# Patient Record
Sex: Female | Born: 1980 | Race: Black or African American | Hispanic: No | Marital: Single | Smoking: Former smoker
Health system: Southern US, Community
[De-identification: ages and names within clinical notes are randomized; demographics above are authoritative.]

## PROBLEM LIST (undated history)

## (undated) ENCOUNTER — Inpatient Hospital Stay (HOSPITAL_COMMUNITY): Payer: Self-pay

## (undated) DIAGNOSIS — R87619 Unspecified abnormal cytological findings in specimens from cervix uteri: Secondary | ICD-10-CM

## (undated) DIAGNOSIS — IMO0002 Reserved for concepts with insufficient information to code with codable children: Secondary | ICD-10-CM

## (undated) DIAGNOSIS — I10 Essential (primary) hypertension: Secondary | ICD-10-CM

## (undated) DIAGNOSIS — O139 Gestational [pregnancy-induced] hypertension without significant proteinuria, unspecified trimester: Secondary | ICD-10-CM

## (undated) DIAGNOSIS — D649 Anemia, unspecified: Secondary | ICD-10-CM

## (undated) HISTORY — DX: Essential (primary) hypertension: I10

## (undated) HISTORY — DX: Unspecified abnormal cytological findings in specimens from cervix uteri: R87.619

## (undated) HISTORY — DX: Reserved for concepts with insufficient information to code with codable children: IMO0002

---
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
## 2001-02-13 ENCOUNTER — Ambulatory Visit (HOSPITAL_COMMUNITY): Admission: RE | Admit: 2001-02-13 | Discharge: 2001-02-13 | Payer: Self-pay | Admitting: *Deleted

## 2001-04-27 ENCOUNTER — Inpatient Hospital Stay (HOSPITAL_COMMUNITY): Admission: AD | Admit: 2001-04-27 | Discharge: 2001-04-27 | Payer: Self-pay | Admitting: *Deleted

## 2001-04-29 ENCOUNTER — Encounter (INDEPENDENT_AMBULATORY_CARE_PROVIDER_SITE_OTHER): Payer: Self-pay

## 2001-04-29 ENCOUNTER — Inpatient Hospital Stay (HOSPITAL_COMMUNITY): Admission: AD | Admit: 2001-04-29 | Discharge: 2001-05-04 | Payer: Self-pay | Admitting: *Deleted

## 2001-04-29 ENCOUNTER — Encounter: Payer: Self-pay | Admitting: *Deleted

## 2001-05-07 ENCOUNTER — Inpatient Hospital Stay (HOSPITAL_COMMUNITY): Admission: AD | Admit: 2001-05-07 | Discharge: 2001-05-07 | Payer: Self-pay | Admitting: *Deleted

## 2002-05-03 ENCOUNTER — Emergency Department (HOSPITAL_COMMUNITY): Admission: EM | Admit: 2002-05-03 | Discharge: 2002-05-03 | Payer: Self-pay | Admitting: Emergency Medicine

## 2002-11-22 ENCOUNTER — Emergency Department (HOSPITAL_COMMUNITY): Admission: EM | Admit: 2002-11-22 | Discharge: 2002-11-22 | Payer: Self-pay | Admitting: Emergency Medicine

## 2005-03-01 ENCOUNTER — Inpatient Hospital Stay (HOSPITAL_COMMUNITY): Admission: AD | Admit: 2005-03-01 | Discharge: 2005-03-01 | Payer: Self-pay | Admitting: Family Medicine

## 2005-08-09 ENCOUNTER — Inpatient Hospital Stay (HOSPITAL_COMMUNITY): Admission: RE | Admit: 2005-08-09 | Discharge: 2005-08-12 | Payer: Self-pay | Admitting: Obstetrics

## 2005-12-10 ENCOUNTER — Emergency Department (HOSPITAL_COMMUNITY): Admission: EM | Admit: 2005-12-10 | Discharge: 2005-12-11 | Payer: Self-pay | Admitting: Emergency Medicine

## 2006-02-14 ENCOUNTER — Emergency Department (HOSPITAL_COMMUNITY): Admission: EM | Admit: 2006-02-14 | Discharge: 2006-02-14 | Payer: Self-pay | Admitting: Emergency Medicine

## 2006-05-02 ENCOUNTER — Encounter (INDEPENDENT_AMBULATORY_CARE_PROVIDER_SITE_OTHER): Payer: Self-pay | Admitting: *Deleted

## 2006-07-29 ENCOUNTER — Inpatient Hospital Stay (HOSPITAL_COMMUNITY): Admission: AD | Admit: 2006-07-29 | Discharge: 2006-07-29 | Payer: Self-pay | Admitting: Obstetrics & Gynecology

## 2006-07-29 ENCOUNTER — Ambulatory Visit: Payer: Self-pay | Admitting: *Deleted

## 2006-08-06 ENCOUNTER — Ambulatory Visit: Payer: Self-pay | Admitting: Obstetrics & Gynecology

## 2006-08-15 ENCOUNTER — Ambulatory Visit: Payer: Self-pay | Admitting: Obstetrics & Gynecology

## 2006-08-15 ENCOUNTER — Encounter: Payer: Self-pay | Admitting: Obstetrics & Gynecology

## 2006-08-15 ENCOUNTER — Inpatient Hospital Stay (HOSPITAL_COMMUNITY): Admission: RE | Admit: 2006-08-15 | Discharge: 2006-08-18 | Payer: Self-pay | Admitting: Obstetrics & Gynecology

## 2006-08-21 ENCOUNTER — Inpatient Hospital Stay (HOSPITAL_COMMUNITY): Admission: AD | Admit: 2006-08-21 | Discharge: 2006-08-21 | Payer: Self-pay | Admitting: Obstetrics and Gynecology

## 2006-08-21 ENCOUNTER — Ambulatory Visit: Payer: Self-pay | Admitting: *Deleted

## 2007-05-08 ENCOUNTER — Emergency Department (HOSPITAL_COMMUNITY): Admission: EM | Admit: 2007-05-08 | Discharge: 2007-05-09 | Payer: Self-pay | Admitting: Emergency Medicine

## 2007-08-22 ENCOUNTER — Emergency Department (HOSPITAL_COMMUNITY): Admission: EM | Admit: 2007-08-22 | Discharge: 2007-08-22 | Payer: Self-pay | Admitting: Emergency Medicine

## 2008-05-29 ENCOUNTER — Emergency Department (HOSPITAL_COMMUNITY): Admission: EM | Admit: 2008-05-29 | Discharge: 2008-05-30 | Payer: Self-pay | Admitting: Emergency Medicine

## 2009-01-04 ENCOUNTER — Emergency Department (HOSPITAL_COMMUNITY): Admission: EM | Admit: 2009-01-04 | Discharge: 2009-01-04 | Payer: Self-pay | Admitting: Family Medicine

## 2009-06-30 ENCOUNTER — Emergency Department (HOSPITAL_COMMUNITY): Admission: EM | Admit: 2009-06-30 | Discharge: 2009-06-30 | Payer: Self-pay | Admitting: Family Medicine

## 2009-08-29 ENCOUNTER — Emergency Department (HOSPITAL_COMMUNITY): Admission: EM | Admit: 2009-08-29 | Discharge: 2009-08-29 | Payer: Self-pay | Admitting: Family Medicine

## 2010-01-23 ENCOUNTER — Emergency Department (HOSPITAL_COMMUNITY): Admission: EM | Admit: 2010-01-23 | Discharge: 2010-01-23 | Payer: Self-pay | Admitting: Emergency Medicine

## 2010-06-14 LAB — CBC
MCV: 81.2 fL (ref 78.0–100.0)
Platelets: 176 10*3/uL (ref 150–400)
RBC: 4.98 MIL/uL (ref 3.87–5.11)
WBC: 7.7 10*3/uL (ref 4.0–10.5)

## 2010-06-14 LAB — URINALYSIS, ROUTINE W REFLEX MICROSCOPIC
Bilirubin Urine: NEGATIVE
Glucose, UA: NEGATIVE mg/dL
Hgb urine dipstick: NEGATIVE
Protein, ur: NEGATIVE mg/dL
Urobilinogen, UA: 1 mg/dL (ref 0.0–1.0)

## 2010-06-14 LAB — BASIC METABOLIC PANEL
BUN: 5 mg/dL — ABNORMAL LOW (ref 6–23)
Chloride: 107 mEq/L (ref 96–112)
Creatinine, Ser: 0.84 mg/dL (ref 0.4–1.2)
GFR calc Af Amer: >60 mL/min (ref >60)
GFR calc non Af Amer: >60 mL/min (ref >60)
Potassium: 3.6 mEq/L (ref 3.5–5.1)

## 2010-06-14 LAB — URINE MICROSCOPIC-ADD ON

## 2010-06-14 LAB — DIFFERENTIAL
Lymphocytes Relative: 13 % (ref 12–46)
Lymphs Abs: 1 10*3/uL (ref 0.7–4.0)
Monocytes Relative: 10 % (ref 3–12)
Neutro Abs: 5.8 10*3/uL (ref 1.7–7.7)
Neutrophils Relative %: 75 % (ref 43–77)

## 2010-06-14 LAB — POCT PREGNANCY, URINE: Preg Test, Ur: NEGATIVE

## 2010-06-17 ENCOUNTER — Emergency Department (HOSPITAL_COMMUNITY)
Admission: EM | Admit: 2010-06-17 | Discharge: 2010-06-17 | Disposition: A | Discharge status: Home / Self Care | Payer: Self-pay | Attending: Emergency Medicine | Admitting: Emergency Medicine

## 2010-07-17 NOTE — Discharge Summary (Signed)
NAME:  Jenny Mann, Jenny Mann           ACCOUNT NO.:  [id number]   MEDICAL RECORD NO.:  [id number]          PATIENT TYPE:  INP   LOCATION:  9109                          FACILITY:  [facility]   PHYSICIAN:  Sylvan Cheese, M.D.       DATE OF BIRTH:  December 14, 1980   DATE OF ADMISSION:  08/15/2006  DATE OF DISCHARGE:  08/18/2006                               DISCHARGE SUMMARY   DISCHARGE DIAGNOSES:  1. Delivery of viable female infant via a repeat low transverse cesarean      section.  2. Obesity.  3. Late prenatal care.   DISCHARGE MEDICATIONS:  1. Ibuprofen 600 mg one tablet every 6 hours as needed for pain.  2. Percocet 5/325 1-2 tablets every 4-6 hours as needed for pain.  3. Depo-Provera shot given prior to discharge.   PERTINENT LAB RESULTS:  Blood type O+, RPR nonreactive, rubella immune,  hepatitis B surface antigen negative, GBS positive, HIV nonreactive.   HOSPITAL COURSE:  This is a 30 year old gravida 3, para 3, 0-0-3 African  American female who was admitted for scheduled cesarean section with a  history of previous cesarean section x2.  She underwent a low transverse  cesarean section by Dr. Penne Lash and Dr. Wilburt Finlay without any  complication.  Please see operative note for full details of this  procedure.  This procedure resulted in the delivery of a viable female  infant with Apgars of 8 at one minute and 8 at 5 minutes.  He weighed 7  pounds 3 ounces and was transferred to the neonatal intensive care unit  for a pneumothorax.  The patient does desire circumcision for this  infant and this will be done when the baby is determined to be  stabilized by the NICU physicians.  The patient had a routine post  operative course.  She is currently bottle feeding and desires Depo-  Provera prior to discharge and then IUD insertion at her 6-week follow-  up visit.  Her staples will be removed on post operative day #5 by a  [facility] nurse in her home.  Jackline has been counseled extensively  regarding the risk of repeat pregnancy given that she had a uterine  window at the time of delivery.   FOLLOW-UP INSTRUCTIONS:  As mentioned above Sofi will have her staples  removed in 2 days by a [facility] nurse.  She was [facility] 6-week postpartum check and IUD insertion.           ______________________________  Sylvan Cheese, M.D.     MJ/MEDQ  D:  08/18/2006  T:  08/18/2006  Job:  [id number]

## 2010-07-17 NOTE — Op Note (Signed)
NAME:  Jenny Mann, Jenny Mann           ACCOUNT NO.:  [id number]   MEDICAL RECORD NO.:  [id number]          PATIENT TYPE:  INP   LOCATION:  9109                          FACILITY:  [facility]   PHYSICIAN:  Lesly Dukes, M.D. DATE OF BIRTH:  20-Nov-1980   DATE OF PROCEDURE:  08/15/2006  DATE OF DISCHARGE:                               OPERATIVE REPORT   PREOPERATIVE DIAGNOSES:  1. Intrauterine pregnancy at 18 weeks' gestation.  2. History of previous cesarean section x2.  3. Anemia.  4. Obesity.  5. Late prenatal care.   POSTOPERATIVE DIAGNOSES:  1. Intrauterine pregnancy at 29 weeks' gestation.  2. History of previous cesarean section x2.  3. Anemia.  4. Obesity.  5. Late prenatal care.   PROCEDURE:  Repeat low transverse cesarean section.   SURGEON:  Dr. Elsie Lincoln.   ASSISTANT:  Dr. Wilburt Finlay.   ANESTHESIA:  Spinal.   SPECIMEN:  Placenta sent to labor and delivery, and cord blood sent to  laboratory.   ESTIMATED BLOOD LOSS:  800.   COMPLICATIONS:  None.   FINDINGS:  Viable female infant with Apgars of 8 and 8, birth weight 7  pounds 3 ounces.   PROCEDURE:  The patient was taken to the operating room where her spinal  anesthesia was found to be adequate.  She was then prepped and draped in  a normal sterile fashion in the dorsal supine position with a leftward  tilt.  A Pfannenstiel skin incision was then made with scalpel and  carried through to the underlying layer of fascia.  The fascia was  incised in the midline and the incision extended laterally with the Mayo  scissors.  The superior aspect of the fascial incision was then grasped  with the Kocher clamps, elevated and underlying rectus muscles dissected  off bluntly.  Attention was then turned to the inferior aspect of the  incision which in a similar fashion was grasped, tented up with Kocher  clamps and the rectus muscles dissected off bluntly.  The rectus muscles  were then separated in the midline and the  peritoneum identified and  entered sharply with Metzenbaum scissors.  The peritoneal incision was  extended superiorly and inferiorly with good visualization of the  bladder.  The bladder blade was inserted.  There was noted to be a very  large 6 cm translucent window with the baby's head and the baby's hair  could be seen in the lower uterine segment.  The lower uterine segment  was incised in a transverse fashion with the scalpel.  This uterine  incision was then extended bluntly.  The bladder blade was removed and  the infant's head delivered atraumatically.  The nose and mouth were  suctioned and the cord clamped and cut.  The infant was handed off to  the waiting pediatricians.  Cord blood was obtained.  Placenta was then  removed spontaneously.  The uterus exteriorized and cleared of all clots  and debris.  The uterine incision was repaired with 0 Vicryl in a  running locked fashion.  Excellent hemostasis was noted.  The uterine  incision was inspected several  times with excellent hemostasis noted.  The muscles were inspected and the electrocautery used with good  hemostasis.  The gutters were cleared of all clots, and the fascia was  reapproximated with 0 Vicryl in a running fashion.  The skin was closed  with staples.  The patient tolerated the procedure well.  Sponge, lap  and needle counts were correct x2.  Two grams of Ancef was given prior  to procedure.  The patient was taken to the recovery room in stable  condition.     ______________________________  Paticia Stack, MD      Lesly Dukes, M.D.  Electronically Signed    LNJ/MEDQ  D:  08/15/2006  T:  08/15/2006  Job:  [id number]

## 2010-07-20 NOTE — Op Note (Signed)
[name], [name]               ACCOUNT NO.:  [id number]   MEDICAL RECORD NO.:  [id number]          PATIENT TYPE:  INP   LOCATION:  9122                          FACILITY:  [facility]   PHYSICIAN:  Kathreen Cosier, M.D.DATE OF BIRTH:  1980-09-20   DATE OF PROCEDURE:  08/09/2005  DATE OF DISCHARGE:                                 OPERATIVE REPORT   The patient is a 30 year old gravida 2, para 1-0-0-1, EDC 08/12/05, who had a  previous cesarean section and desired a repeat.   DISREGARD DICTATION.           ______________________________  Kathreen Cosier, M.D.     BAM/MEDQ  D:  08/09/2005  T:  08/10/2005  Job:  [id number]

## 2010-07-20 NOTE — Discharge Summary (Signed)
[facility]  Patient:    Jenny Mann, Jenny Mann Visit Number: [id number] MRN: [id number]          Service Type: MED Location: [facility] Attending Physician:  Michaelle Copas Dictated by:   Elinor Dodge, M.D. Admit Date:  05/07/2001 Discharge Date: 05/07/2001                             Discharge Summary  HOSPITAL COURSE:              The patient was admitted to [facility] on April 29, 2001 with preeclampsia, term pregnancy.  She underwent low transverse cesarean section by Dr. Gavin Potters for nonreassuring fetal heart pattern.  Postoperative course was normal.  DISPOSITION:                  She was given instructions as to follow up, when to return for staple removal. She was to call the clinic if she had any chills, fever, or redness of the abdominal incision.  She was to avoid heavy lifting.  She was started on Ortho Evra for contraception and continued on multivitamins.  She was discharged on Motrin and Percocet. Dictated by:   Elinor Dodge, M.D. Attending Physician:  Michaelle Copas DD:  06/08/01 TD:  06/09/01 Job: [id number]

## 2010-07-20 NOTE — Op Note (Signed)
Jenny Mann, Jenny Mann               ACCOUNT NO.:  [id number]   MEDICAL RECORD NO.:  [id number]          PATIENT TYPE:  INP   LOCATION:  9122                          [facility]ACILITY:  [facility]   PHYSICIAN:  Kathreen Cosier, M.D.DATE OF BIRTH:  09/07/1980   DATE OF PROCEDURE:  08/09/2005  DATE OF DISCHARGE:                                 OPERATIVE REPORT   PREOPERATIVE DIAGNOSIS:  Previous cesarean section at term, desires repeat.   POSTOPERATIVE DIAGNOSIS:   OPERATION PERFORMED:   SURGEON:  Kathreen Cosier, M.D.   ASSISTANT:  Tamela Oddi.   ANESTHESIA:  Spinal.   DESCRIPTION OF PROCEDURE:  Patient placed on the operating table in supine  position after the spinal administered.  Abdomen prepped.  Bladder emptied  with a Foley catheter.  A transverse suprapubic incision was made through  the old scar and carried down to the fascia.  The fascia cleaned and incised  the length of the incision.  The rectus muscles were retracted laterally,  peritoneum incised longitudinally.  Transverse incision made in the viscera  peritoneum above the bladder and the bladder mobilized inferiorly.  Transverse lower uterine incision made.  The fluid was clear.  The patient  delivered from the LOA position.  Viable female, Apgar 8 and 9, weighing 7  pounds, 14 ounces.  Team was in attendance.  Placenta was posterior and  removed manually.  Uterine cavity cleaned with dry laps.  Uterine incision  closed in one layer with continuous suture of #1 chromic.  Hemostasis  satisfactory.  Bladder flap reattached with 2-0 chromic.  Uterus well  contracted.  Tubes and ovaries normal.  Abdomen closed in layers.  Peritoneum continuous suture of 0 chromic.  Fascia continuous suture of 0  Dexon and the skin closed with subcuticular stitch of 4-0 Monocryl.  Blood  loss 500 mL.  The patient tolerated the procedure well, taken to recovery  room in good condition.           ______________________________  Kathreen Cosier, M.D.     BAM/MEDQ  D:  08/09/2005  T:  08/10/2005  Job:  [id number]

## 2010-07-20 NOTE — Op Note (Signed)
[facility]  Patient:    Jenny Mann, Jenny Mann Visit Number: [id number] MRN: [id number]          Service Type: OBS Location: 9400 [id number] Attending Physician:  Michaelle Copas Dictated by:   Conni Elliot, M.D. Proc. Date: 04/30/01 Admit Date:  04/29/2001                             Operative Report  PREOPERATIVE DIAGNOSES:       1. Pregnancy with late uterine decelerations.                               2. Significant decrease in short-term                                  variability.                               3. Preeclampsia.  POSTOPERATIVE DIAGNOSES:      1. Pregnancy with late uterine decelerations.                               2. Significant decrease in short-term                                  variability.                               3. Preeclampsia.  OPERATION:                    Low transverse cesarean section.  SURGEON:                      Conni Elliot, M.D.  ASSISTANT:                    ______, M.D.  ANESTHESIA:                   Continual epidural.  OPERATIVE FINDINGS:           A 7 lb 14 oz female with Apgars of 8 and 9. Cord pH 7.28.  The placenta was sent to pathology.  DESCRIPTION OF PROCEDURE:     The patient was brought to the operating room with oxygen.  The abdomen was prepped and draped in a sterile fashion.  A low transverse Pfannenstiel incision was made.  The incision was continued through the fascia.  The rectus muscles were separated in the midline.  The peritoneal cavity was entered.  A bladder flap and a lower transverse uterine incision was made.  The infants vertex was delivered.  The infant was DeLee suctioned prior to delivery of the chest.  The remainder of the body was delivered.  The cord was doubly clamped and cut.  The baby was handed to the neonatologist in attendance.  The placenta was delivered manually.  The uterus, bladder flap, anterior peritoneum, fascia and skin was  reapproximated.  Estimated blood loss was less than 800 cc. Dictated by:   Conni Elliot, M.D. Attending  Physician:  Michaelle Copas DD:  05/01/01 TD:  05/01/01 Job: [id number]

## 2010-07-20 NOTE — Discharge Summary (Signed)
Jenny Mann, Jenny Mann               ACCOUNT NO.:  [id number]   MEDICAL RECORD NO.:  [id number]          PATIENT TYPE:  INP   LOCATION:  9122                          FACILITY:  [facility]   PHYSICIAN:  Kathreen Cosier, M.D.DATE OF BIRTH:  01-17-1981   DATE OF ADMISSION:  08/09/2005  DATE OF DISCHARGE:  08/12/2005                                 DISCHARGE SUMMARY   The patient is a 31 year old gravida 2, para 1-0-0-1 who had a previous C-  section.  Her EDC was August 12, 2005, and she desired repeat C-section.  She  was now at term.  She had a repeat low transverse caesarean section and had  a female, Apgar 8 and 9, weighing 7 pounds 14 ounces, in the LOA position.  Post delivery, she had some vaginal bleeding, and a number of clots were  removed from the vagina.  She was given 1 dose of Methergine and Humibid.  On post-op day 1, her hemoglobin was 7.2, down from 10.  The patient was  asymptomatic.  Her platelets were 177 and then 123, white count 10.2.  PT/PTT was normal.  Sodium 136, potassium 3.7, chloride 106.  Urine was  negative.  She was discharged on the third postoperative day with a  hemoglobin of 7.2 on Tylox for pain and ferrous sulfate for anemia.   DISCHARGE DIAGNOSES:  1.  Status post elective repeat caesarean section at term.  2.  Postpartum hemorrhage.           ______________________________  Kathreen Cosier, M.D.     BAM/MEDQ  D:  09/04/2005  T:  09/04/2005  Job:  [id number]

## 2010-11-26 LAB — URINALYSIS, ROUTINE W REFLEX MICROSCOPIC
Glucose, UA: NEGATIVE
Hgb urine dipstick: NEGATIVE
Protein, ur: NEGATIVE
Specific Gravity, Urine: 1.015
pH: 5.5

## 2010-11-26 LAB — URINE MICROSCOPIC-ADD ON

## 2010-12-01 ENCOUNTER — Emergency Department (HOSPITAL_COMMUNITY): Payer: Self-pay

## 2010-12-01 ENCOUNTER — Emergency Department (HOSPITAL_COMMUNITY)
Admission: EM | Admit: 2010-12-01 | Discharge: 2010-12-01 | Disposition: A | Discharge status: Home / Self Care | Payer: Self-pay | Attending: Emergency Medicine | Admitting: Emergency Medicine

## 2010-12-01 DIAGNOSIS — Y92009 Unspecified place in unspecified non-institutional (private) residence as the place of occurrence of the external cause: Secondary | ICD-10-CM | POA: Insufficient documentation

## 2010-12-01 DIAGNOSIS — M21619 Bunion of unspecified foot: Secondary | ICD-10-CM | POA: Insufficient documentation

## 2010-12-01 DIAGNOSIS — S93409A Sprain of unspecified ligament of unspecified ankle, initial encounter: Secondary | ICD-10-CM | POA: Insufficient documentation

## 2010-12-01 DIAGNOSIS — S93609A Unspecified sprain of unspecified foot, initial encounter: Secondary | ICD-10-CM | POA: Insufficient documentation

## 2010-12-01 DIAGNOSIS — W010XXA Fall on same level from slipping, tripping and stumbling without subsequent striking against object, initial encounter: Secondary | ICD-10-CM | POA: Insufficient documentation

## 2010-12-19 LAB — CBC
HCT: 27.1 — ABNORMAL LOW
MCHC: 32.7
MCV: 65.9 — ABNORMAL LOW
Platelets: 260
WBC: 7.8

## 2010-12-19 LAB — URINALYSIS, ROUTINE W REFLEX MICROSCOPIC
Ketones, ur: NEGATIVE
Nitrite: NEGATIVE
Protein, ur: NEGATIVE
pH: 7.5

## 2010-12-19 LAB — COMPREHENSIVE METABOLIC PANEL
Albumin: 2.3 — ABNORMAL LOW
BUN: 6
Calcium: 8.4
Chloride: 106
Creatinine, Ser: 0.75
GFR calc non Af Amer: >60
Total Bilirubin: 0.4

## 2010-12-19 LAB — URIC ACID: Uric Acid, Serum: 5.8

## 2010-12-20 LAB — POCT URINALYSIS DIP (DEVICE)
Ketones, ur: NEGATIVE
Protein, ur: NEGATIVE
Specific Gravity, Urine: 1.01
pH: 7.5

## 2010-12-20 LAB — RPR: RPR Ser Ql: NONREACTIVE

## 2010-12-20 LAB — CBC
HCT: 25.1 — ABNORMAL LOW
MCHC: 32.6
Platelets: 136 — ABNORMAL LOW
RBC: 3.78 — ABNORMAL LOW
RBC: 4.82
RDW: 17 — ABNORMAL HIGH
WBC: 8.7

## 2011-05-08 ENCOUNTER — Emergency Department (HOSPITAL_COMMUNITY): Admission: EM | Admit: 2011-05-08 | Discharge: 2011-05-08 | Payer: Self-pay | Source: Home / Self Care

## 2011-11-25 DIAGNOSIS — R87613 High grade squamous intraepithelial lesion on cytologic smear of cervix (HGSIL): Secondary | ICD-10-CM | POA: Insufficient documentation

## 2012-03-10 ENCOUNTER — Ambulatory Visit (HOSPITAL_COMMUNITY): Payer: Self-pay

## 2012-03-13 ENCOUNTER — Ambulatory Visit (INDEPENDENT_AMBULATORY_CARE_PROVIDER_SITE_OTHER): Payer: Self-pay | Admitting: Obstetrics & Gynecology

## 2012-03-13 ENCOUNTER — Other Ambulatory Visit (HOSPITAL_COMMUNITY)
Admission: RE | Admit: 2012-03-13 | Discharge: 2012-03-13 | Disposition: A | Discharge status: Home / Self Care | Payer: Self-pay | Source: Ambulatory Visit | Attending: Obstetrics & Gynecology | Admitting: Obstetrics & Gynecology

## 2012-03-13 ENCOUNTER — Encounter: Payer: Self-pay | Admitting: Obstetrics & Gynecology

## 2012-03-13 VITALS — BP 120/73 | HR 84 | Temp 97.2°F | Ht 63.0 in | Wt 304.0 lb

## 2012-03-13 DIAGNOSIS — R87619 Unspecified abnormal cytological findings in specimens from cervix uteri: Secondary | ICD-10-CM | POA: Insufficient documentation

## 2012-03-13 DIAGNOSIS — Z3201 Encounter for pregnancy test, result positive: Secondary | ICD-10-CM

## 2012-03-13 DIAGNOSIS — R87613 High grade squamous intraepithelial lesion on cytologic smear of cervix (HGSIL): Secondary | ICD-10-CM

## 2012-03-13 LAB — POCT PREGNANCY, URINE: Preg Test, Ur: POSITIVE — AB

## 2012-03-13 NOTE — Patient Instructions (Signed)

## 2012-03-13 NOTE — Progress Notes (Signed)
32 y.o. [id number] here for colposcopy for high-grade squamous intraepithelial neoplasia  (HGSIL-encompassing moderate and severe dysplasia) pap smear on 11/25/11.  Patient recently found out she was pregnant, unsure LMP of 01/15/12, had two positive UPT at home. Discussed role for HPV in cervical dysplasia, need for surveillance.  Patient given informed consent, signed copy in the chart, time out was performed.  Placed in lithotomy position. Cervix viewed with speculum and colposcope after application of acetic acid.   Colposcopy adequate? Yes Areas of acetowhite change and mosaicism seen from 11 to 1 o'clock and around 9 o'clock; two biopsies obtained.  ECC specimen obtained. All specimens were labeled and sent to pathology.  Patient was given post procedure instructions.  Will follow up pathology and manage accordingly.  Routine preventative health maintenance measures emphasized.  Patient will decide about where to initiate prenatal care.

## 2012-03-17 ENCOUNTER — Encounter: Payer: Self-pay | Admitting: *Deleted

## 2012-03-17 ENCOUNTER — Encounter: Payer: Self-pay | Admitting: Obstetrics & Gynecology

## 2012-04-15 ENCOUNTER — Ambulatory Visit (INDEPENDENT_AMBULATORY_CARE_PROVIDER_SITE_OTHER): Payer: Self-pay | Admitting: Family Medicine

## 2012-04-15 ENCOUNTER — Encounter: Payer: Self-pay | Admitting: Family Medicine

## 2012-04-15 VITALS — BP 131/88 | Temp 97.1°F | Wt 307.8 lb

## 2012-04-15 DIAGNOSIS — Z3492 Encounter for supervision of normal pregnancy, unspecified, second trimester: Secondary | ICD-10-CM

## 2012-04-15 DIAGNOSIS — T63301A Toxic effect of unspecified spider venom, accidental (unintentional), initial encounter: Secondary | ICD-10-CM

## 2012-04-15 DIAGNOSIS — O34219 Maternal care for unspecified type scar from previous cesarean delivery: Secondary | ICD-10-CM

## 2012-04-15 DIAGNOSIS — T63391A Toxic effect of venom of other spider, accidental (unintentional), initial encounter: Secondary | ICD-10-CM

## 2012-04-15 DIAGNOSIS — O0990 Supervision of high risk pregnancy, unspecified, unspecified trimester: Secondary | ICD-10-CM | POA: Insufficient documentation

## 2012-04-15 DIAGNOSIS — T6391XA Toxic effect of contact with unspecified venomous animal, accidental (unintentional), initial encounter: Secondary | ICD-10-CM

## 2012-04-15 DIAGNOSIS — O09299 Supervision of pregnancy with other poor reproductive or obstetric history, unspecified trimester: Secondary | ICD-10-CM

## 2012-04-15 LAB — GLUCOSE TOLERANCE, 1 HOUR (50G) W/O FASTING: Glucose, 1 Hour GTT: 88 mg/dL (ref 70–140)

## 2012-04-15 MED ORDER — RANITIDINE HCL 150 MG PO TABS: 150.0000 mg | ORAL_TABLET | Freq: Two times a day (BID) | ORAL | Status: DC

## 2012-04-15 MED ORDER — ONDANSETRON HCL 4 MG PO TABS: 4.0000 mg | ORAL_TABLET | Freq: Three times a day (TID) | ORAL | Status: DC | PRN

## 2012-04-15 MED ORDER — PRENATAL VITAMINS 0.8 MG PO TABS: 1.0000 | ORAL_TABLET | Freq: Every day | ORAL | Status: DC

## 2012-04-15 MED ORDER — PROMETHAZINE HCL 25 MG PO TABS: 25.0000 mg | ORAL_TABLET | Freq: Four times a day (QID) | ORAL | Status: DC | PRN

## 2012-04-15 NOTE — Patient Instructions (Signed)
Pregnancy - Second Trimester The second trimester of pregnancy (3 to 6 months) is a period of rapid growth for you and your baby. At the end of the sixth month, your baby is about 9 inches long and weighs 1 1/2 pounds. You will begin to feel the baby move between 18 and 20 weeks of the pregnancy. This is called quickening. Weight gain is faster. A clear fluid (colostrum) may leak out of your breasts. You may feel small contractions of the womb (uterus). This is known as false labor or Braxton-Hicks contractions. This is like a practice for labor when the baby is ready to be born. Usually, the problems with morning sickness have usually passed by the end of your first trimester. Some women develop small dark blotches (called cholasma, mask of pregnancy) on their face that usually goes away after the baby is born. Exposure to the sun makes the blotches worse. Acne may also develop in some pregnant women and pregnant women who have acne, may find that it goes away. PRENATAL EXAMS  Blood work may continue to be done during prenatal exams. These tests are done to check on your health and the probable health of your baby. Blood work is used to follow your blood levels (hemoglobin). Anemia (low hemoglobin) is common during pregnancy. Iron and vitamins are given to help prevent this. You will also be checked for diabetes between 24 and 28 weeks of the pregnancy. Some of the previous blood tests may be repeated.  The size of the uterus is measured during each visit. This is to make sure that the baby is continuing to grow properly according to the dates of the pregnancy.  Your blood pressure is checked every prenatal visit. This is to make sure you are not getting toxemia.  Your urine is checked to make sure you do not have an infection, diabetes or protein in the urine.  Your weight is checked often to make sure gains are happening at the suggested rate. This is to ensure that both you and your baby are growing  normally.  Sometimes, an ultrasound is performed to confirm the proper growth and development of the baby. This is a test which bounces harmless sound waves off the baby so your caregiver can more accurately determine due dates. Sometimes, a specialized test is done on the amniotic fluid surrounding the baby. This test is called an amniocentesis. The amniotic fluid is obtained by sticking a needle into the belly (abdomen). This is done to check the chromosomes in instances where there is a concern about possible genetic problems with the baby. It is also sometimes done near the end of pregnancy if an early delivery is required. In this case, it is done to help make sure the baby's lungs are mature enough for the baby to live outside of the womb. CHANGES OCCURING IN THE SECOND TRIMESTER OF PREGNANCY Your body goes through many changes during pregnancy. They vary from person to person. Talk to your caregiver about changes you notice that you are concerned about.  During the second trimester, you will likely have an increase in your appetite. It is normal to have cravings for certain foods. This varies from person to person and pregnancy to pregnancy.  Your lower abdomen will begin to bulge.  You may have to urinate more often because the uterus and baby are pressing on your bladder. It is also common to get more bladder infections during pregnancy (pain with urination). You can help this by   drinking lots of fluids and emptying your bladder before and after intercourse.  You may begin to get stretch marks on your hips, abdomen, and breasts. These are normal changes in the body during pregnancy. There are no exercises or medications to take that prevent this change.  You may begin to develop swollen and bulging veins (varicose veins) in your legs. Wearing support hose, elevating your feet for 15 minutes, 3 to 4 times a day and limiting salt in your diet helps lessen the problem.  Heartburn may develop  as the uterus grows and pushes up against the stomach. Antacids recommended by your caregiver helps with this problem. Also, eating smaller meals 4 to 5 times a day helps.  Constipation can be treated with a stool softener or adding bulk to your diet. Drinking lots of fluids, vegetables, fruits, and whole grains are helpful.  Exercising is also helpful. If you have been very active up until your pregnancy, most of these activities can be continued during your pregnancy. If you have been less active, it is helpful to start an exercise program such as walking.  Hemorrhoids (varicose veins in the rectum) may develop at the end of the second trimester. Warm sitz baths and hemorrhoid cream recommended by your caregiver helps hemorrhoid problems.  Backaches may develop during this time of your pregnancy. Avoid heavy lifting, wear low heal shoes and practice good posture to help with backache problems.  Some pregnant women develop tingling and numbness of their hand and fingers because of swelling and tightening of ligaments in the wrist (carpel tunnel syndrome). This goes away after the baby is born.  As your breasts enlarge, you may have to get a bigger bra. Get a comfortable, cotton, support bra. Do not get a nursing bra until the last month of the pregnancy if you will be nursing the baby.  You may get a dark line from your belly button to the pubic area called the linea nigra.  You may develop rosy cheeks because of increase blood flow to the face.  You may develop spider looking lines of the face, neck, arms and chest. These go away after the baby is born. HOME CARE INSTRUCTIONS   It is extremely important to avoid all smoking, herbs, alcohol, and unprescribed drugs during your pregnancy. These chemicals affect the formation and growth of the baby. Avoid these chemicals throughout the pregnancy to ensure the delivery of a healthy infant.  Most of your home care instructions are the same as  suggested for the first trimester of your pregnancy. Keep your caregiver's appointments. Follow your caregiver's instructions regarding medication use, exercise and diet.  During pregnancy, you are providing food for you and your baby. Continue to eat regular, well-balanced meals. Choose foods such as meat, fish, milk and other low fat dairy products, vegetables, fruits, and whole-grain breads and cereals. Your caregiver will tell you of the ideal weight gain.  A physical sexual relationship may be continued up until near the end of pregnancy if there are no other problems. Problems could include early (premature) leaking of amniotic fluid from the membranes, vaginal bleeding, abdominal pain, or other medical or pregnancy problems.  Exercise regularly if there are no restrictions. Check with your caregiver if you are unsure of the safety of some of your exercises. The greatest weight gain will occur in the last 2 trimesters of pregnancy. Exercise will help you:  Control your weight.  Get you in shape for labor and delivery.  Lose weight   after you have the baby.  Wear a good support or jogging bra for breast tenderness during pregnancy. This may help if worn during sleep. Pads or tissues may be used in the bra if you are leaking colostrum.  Do not use hot tubs, steam rooms or saunas throughout the pregnancy.  Wear your seat belt at all times when driving. This protects you and your baby if you are in an accident.  Avoid raw meat, uncooked cheese, cat litter boxes and soil used by cats. These carry germs that can cause birth defects in the baby.  The second trimester is also a good time to visit your dentist for your dental health if this has not been done yet. Getting your teeth cleaned is OK. Use a soft toothbrush. Brush gently during pregnancy.  It is easier to loose urine during pregnancy. Tightening up and strengthening the pelvic muscles will help with this problem. Practice stopping your  urination while you are going to the bathroom. These are the same muscles you need to strengthen. It is also the muscles you would use as if you were trying to stop from passing gas. You can practice tightening these muscles up 10 times a set and repeating this about 3 times per day. Once you know what muscles to tighten up, do not perform these exercises during urination. It is more likely to contribute to an infection by backing up the urine.  Ask for help if you have financial, counseling or nutritional needs during pregnancy. Your caregiver will be able to offer counseling for these needs as well as refer you for other special needs.  Your skin may become oily. If so, wash your face with mild soap, use non-greasy moisturizer and oil or cream based makeup. MEDICATIONS AND DRUG USE IN PREGNANCY  Take prenatal vitamins as directed. The vitamin should contain 1 milligram of folic acid. Keep all vitamins out of reach of children. Only a couple vitamins or tablets containing iron may be fatal to a baby or young child when ingested.  Avoid use of all medications, including herbs, over-the-counter medications, not prescribed or suggested by your caregiver. Only take over-the-counter or prescription medicines for pain, discomfort, or fever as directed by your caregiver. Do not use aspirin.  Let your caregiver also know about herbs you may be using.  Alcohol is related to a number of birth defects. This includes fetal alcohol syndrome. All alcohol, in any form, should be avoided completely. Smoking will cause low birth rate and premature babies.  Street or illegal drugs are very harmful to the baby. They are absolutely forbidden. A baby born to an addicted mother will be addicted at birth. The baby will go through the same withdrawal an adult does. SEEK MEDICAL CARE IF:  You have any concerns or worries during your pregnancy. It is better to call with your questions if you feel they cannot wait, rather  than worry about them. SEEK IMMEDIATE MEDICAL CARE IF:   An unexplained oral temperature above 102 F (38.9 C) develops, or as your caregiver suggests.  You have leaking of fluid from the vagina (birth canal). If leaking membranes are suspected, take your temperature and tell your caregiver of this when you call.  There is vaginal spotting, bleeding, or passing clots. Tell your caregiver of the amount and how many pads are used. Light spotting in pregnancy is common, especially following intercourse.  You develop a bad smelling vaginal discharge with a change in the color from clear   to white.  You continue to feel sick to your stomach (nauseated) and have no relief from remedies suggested. You vomit blood or coffee ground-like materials.  You lose more than 2 pounds of weight or gain more than 2 pounds of weight over 1 week, or as suggested by your caregiver.  You notice swelling of your face, hands, feet, or legs.  You get exposed to [place] measles and have never had them.  You are exposed to fifth disease or chickenpox.  You develop belly (abdominal) pain. Round ligament discomfort is a common non-cancerous (benign) cause of abdominal pain in pregnancy. Your caregiver still must evaluate you.  You develop a bad headache that does not go away.  You develop fever, diarrhea, pain with urination, or shortness of breath.  You develop visual problems, blurry, or double vision.  You fall or are in a car accident or any kind of trauma.  There is mental or physical violence at home. Document Released: 02/12/2001 Document Revised: 05/13/2011 Document Reviewed: 08/17/2008 [facility] Patient Information 2013 [place], [place].  Hyperemesis Gravidarum Hyperemesis gravidarum is a severe form of nausea and vomiting that happens during pregnancy. Hyperemesis is worse than morning sickness. It may cause a woman to have nausea or vomiting all day for many days. It may keep a woman from eating and  drinking enough food and liquids. Hyperemesis usually occurs during the first half (the first 20 weeks) of pregnancy. It often goes away once a woman is in her second half of pregnancy. However, sometimes hyperemesis continues through an entire pregnancy.  CAUSES  The cause of this condition is not completely known but is thought to be due to changes in the body's hormones when pregnant. It could be the high level of the pregnancy hormone or an increase in estrogen in the body.  SYMPTOMS   Severe nausea and vomiting.  Nausea that does not go away.  Vomiting that does not allow you to keep any food down.  Weight loss and body fluid loss (dehydration).  Having no desire to eat or not liking food you have previously enjoyed. DIAGNOSIS  Your caregiver may ask you about your symptoms. Your caregiver may also order blood tests and urine tests to make sure something else is not causing the problem.  TREATMENT  You may only need medicine to control the problem. If medicines do not control the nausea and vomiting, you will be treated in the hospital to prevent dehydration, acidosis, weight loss, and changes in the electrolytes in your body that may harm the unborn baby (fetus). You may need intravenous (IV) fluids.  HOME CARE INSTRUCTIONS   Take all medicine as directed by your caregiver.  Try eating a couple of dry crackers or toast in the morning before getting out of bed.  Avoid foods and smells that upset your stomach.  Avoid fatty and spicy foods. Eat 5 to 6 small meals a day.  Do not drink when eating meals. Drink between meals.  For snacks, eat high protein foods, such as cheese. Eat or suck on things that have ginger in them. Ginger helps nausea.  Avoid food preparation. The smell of food can spoil your appetite.  Avoid iron pills and iron in your multivitamins until after 3 to 4 months of being pregnant. SEEK MEDICAL CARE IF:   Your abdominal pain increases since the last time  you saw your caregiver.  You have a severe headache.  You develop vision problems.  You feel you are losing weight.  SEEK IMMEDIATE MEDICAL CARE IF:   You are unable to keep fluids down.  You vomit blood.  You have constant nausea and vomiting.  You have a fever.  You have excessive weakness, dizziness, fainting, or extreme thirst. MAKE SURE YOU:   Understand these instructions.  Will watch your condition.  Will get help right away if you are not doing well or get worse. Document Released: 02/18/2005 Document Revised: 05/13/2011 Document Reviewed: 05/21/2010 [facility] Patient Information 2013 [place], [place].  Spider Bite Spider bites are not common. Most spider bites do not cause serious problems. The elderly, very young children, and people with certain existing medical conditions are more likely to experience significant symptoms. SYMPTOMS  Spider bites may not cause any pain at first. Within 1 or 2 days of the bite, there may be swelling, redness, and pain in the bite area. However, some spider bites can cause pain within the first hour. TREATMENT  Your caregiver may prescribe antibiotic medicine if a bacterial infection develops in the bite. However, not all spider bites require antibiotics or prescription medicines.  HOME CARE INSTRUCTIONS  Do not scratch the bite area.  Keep the bite area clean and dry. Wash the area with soap and water as directed.  Put ice or cool compresses on the bite area.  Put ice in a plastic bag.  Place a towel between your skin and the bag.  Leave the ice on for 20 minutes, 4 times a day for the first 2 to 3 days, or as directed.  Keep the bite area elevated above the level of your heart. This helps reduce redness and swelling.  Only take over-the-counter or prescription medicines as directed by your caregiver.  If you are given antibiotics, take them as directed. Finish them even if you start to feel better. You may need a tetanus  shot if:  You cannot remember when you had your last tetanus shot.  You have never had a tetanus shot.  The injury broke your skin. If you get a tetanus shot, your arm may swell, get red, and feel warm to the touch. This is common and not a problem. If you need a tetanus shot and you choose not to have one, there is a rare chance of getting tetanus. Sickness from tetanus can be serious. SEEK MEDICAL CARE IF: Your bite is not better after 3 days of treatment. SEEK IMMEDIATE MEDICAL CARE IF:  Your bite turns purple or develops increased swelling, pain, or redness.  You develop shortness of breath or chest pain.  You have muscle cramps or painful muscle spasms.  You develop abdominal pain, nausea, or vomiting.  You feel unusually tired or sleepy. MAKE SURE YOU:  Understand these instructions.  Will watch your condition.  Will get help right away if you are not doing well or get worse. Document Released: 03/28/2004 Document Revised: 05/13/2011 Document Reviewed: 09/19/2010 [facility] Patient Information 2013 [place], [place].

## 2012-04-15 NOTE — Progress Notes (Signed)
Patient c/o insect bites on the left hand and on her lower left back.  It is swollen, painful, red, itching and warm to the touch.  Pt is unsure what bit her as it happened during sleep.  She has not taken anything for it.  She put rubbing alcohol on the bites soon after they happened.

## 2012-04-15 NOTE — Progress Notes (Signed)
Subjective:    Jenny Mann is being seen today for her first obstetrical visit.  This is not a planned pregnancy. She is at [redacted]w[redacted]d gestation by approximate LMP, no ultrasounds yet. Her obstetrical history is significant for obesity, history of preeclampsia and GHTN, 3 prior c-sections. Relationship with FOB: significant other, living together. Patient does intend to breast feed. Pregnancy history fully reviewed.  Today she complains of nausea, excessive mouth watering/saliva, occasional sharp lower quadrant pains. Does have severe heartburn on occasion. No diarrhea, constipation, dysuria, vaginal discharge, bleeding, cramping. She does not feel baby move yet, has felt flutter once. She denies headache or vision changes today.  She also c/o bites on her left forearm and back. Felt sting last night, this morning hand is swollen and red, itchy. Back is also itchy. Did not see any spiders or insects.  Three full-term deliveries. First pregnancy with preeclampsia, induced but emergent c-section for fetal distress. Second and third scheduled repeat c/s at 39 weeks.    3rd child died at 9 months of age with SIDS.  Both other children live with her. Father of this baby is new partner.  HTN:  Preelampsia G1, pt thinks she had HTN 2nd and 3rd pregnancy but was not on medications and not sure about preeclampsia. She did have high BP after last delivery and was placed on HCTZ which she took for about a year. But she has not needed any BP meds over the last few years. Medical record reviewed, pt seen 6 days post-op after 3rd baby with incidentally elevated BP and started on HCTZ. After reviewing all other documents (mostly ED visits) since 2009, all recorded BP are normal without medication. Pt states she has seen doctor at [facility] last fall and states no HTN.  Patient had recent pap with HSIL and colpo with ECC concerning for high grade lesion (03/13/12).   Past Medical History  Diagnosis Date  .  Abnormal Pap smear   . Hypertension    Past Surgical History  Procedure Laterality Date  . Cesarean section x 3     Fam hx:  Diabetes: MGM, PGM. HTN:  Mom, MGM, MGF.   Soc Hx:  Stopped smoking > 1 year ago. Alc occasionally before found out pregnant. No drugs. 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 Not currently working.  Menstrual History: OB History   Grav Para Term Preterm Abortions TAB SAB Ect Mult Living   4 3 2  0 0 0 0 0 0 2      Patient's last menstrual period was at end of October, not certain of date. Also had 3 days bleeding in November.    The following portions of the patient's history were reviewed and updated as appropriate: allergies, current medications, past family history, past medical history, past social history, past surgical history and problem list.  Review of Systems Constitutional: negative for chills, fevers and sweats Eyes: negative for visual disturbance Respiratory: negative for asthma, cough and wheezing Cardiovascular: negative for chest pain, chest pressure/discomfort, dyspnea, irregular heart beat and palpitations Gastrointestinal: negative for abdominal pain, change in bowel habits and positive for nausea, heartburn Genitourinary:negative for genital lesions, vaginal discharge and dysuria Neurological: negative for dizziness and headaches    Objective:    BP 131/88  Temp(Src) 97.1 F (36.2 C)  Wt 307 lb 12.8 oz (139.617 kg)  BMI 54.54 kg/m2  LMP 01/02/2012 General appearance: alert, cooperative, no distress and morbidly obese Head: Normocephalic, without obvious abnormality, atraumatic Eyes:  conjunctivae/corneas clear. PERRL, EOM's intact. Fundi benign. Neck: supple, symmetrical, trachea midline and thyroid not enlarged, symmetric, no tenderness/mass/nodules Back: 4-5 cm area of erythema on left lower back with central papule Lungs: clear to auscultation bilaterally Heart: regular rate and rhythm, S1, S2 normal, no  murmur, click, rub or gallop Abdomen: Obese, non-tender, gravid, difficult to appreciate fundal height due to body habitus Pelvic: cervix normal in appearance, external genitalia normal, no cervical motion tenderness, vagina normal without discharge and exam limited by body habitus Extremities: no edema. Left hand with edema and erythema extending across dorsal side and up forearm. Small papule at wrist. Neurologic: Grossly normal    Assessment/Plan:    1.  Pregnancy at 14 and 6/7 weeks   -  Initial labs drawn including 1 hour GTT.  -  Ultrasound to confirm dating asap  - Prenatal vitamins  - Desires genetic testing, will get quad screen at 16-18 weeks  - Desires ultrasound for fetal anatomy survey - will order at 18-20 weeks  2.  History preeclampsia/PIH. BP normal today and no proteinuria. Possible chronic HTN but all BPs in medical record are normal since 2009 and pt has not been on medications. 3.  History previous c-section x 3 with very thin lower uterine segment last pregnancy. Pt desires TOLAC but discussed risks and need for c-section esp given op report from last c-section.  4.  Morbid obesity. Early 1 hour GTT, will try to get in to nutritionist 5.  Abnormal colposcopy.  Per Dr. Macon Large, offer LEEP after delivery 6.  Spider bites - ice, tylenol, benadryl as needed. Pt cautioned to return if lesions worsen or pain shoots up arm/back or fever/chills, nausea/vomiting 7.  F/U 4 weeks Likely will need HRC 8.  Problem list reviewed and updated. 9.  50% of 45 min visit spent on counseling and coordination of care.

## 2012-04-17 ENCOUNTER — Encounter: Payer: Self-pay | Admitting: Family Medicine

## 2012-04-17 ENCOUNTER — Ambulatory Visit (HOSPITAL_COMMUNITY): Payer: Self-pay

## 2012-04-17 LAB — OBSTETRIC PANEL
HCT: 38.3 % (ref 36.0–46.0)
Hemoglobin: 13.1 g/dL (ref 12.0–15.0)
Hepatitis B Surface Ag: NEGATIVE
Lymphs Abs: 1.2 10*3/uL (ref 0.7–4.0)
Monocytes Relative: 10 % (ref 3–12)
Neutro Abs: 4.9 10*3/uL (ref 1.7–7.7)
Neutrophils Relative %: 72 % (ref 43–77)
RBC: 4.71 MIL/uL (ref 3.87–5.11)
Rh Type: POSITIVE
Rubella: 4.04 Index — ABNORMAL HIGH (ref <0.90)

## 2012-04-20 ENCOUNTER — Ambulatory Visit (HOSPITAL_COMMUNITY)
Admission: RE | Admit: 2012-04-20 | Discharge: 2012-04-20 | Disposition: A | Discharge status: Home / Self Care | Payer: Self-pay | Source: Ambulatory Visit | Attending: Family Medicine | Admitting: Family Medicine

## 2012-04-20 ENCOUNTER — Other Ambulatory Visit: Payer: Self-pay | Admitting: Family Medicine

## 2012-04-20 DIAGNOSIS — Z3492 Encounter for supervision of normal pregnancy, unspecified, second trimester: Secondary | ICD-10-CM

## 2012-04-20 DIAGNOSIS — O30109 Triplet pregnancy, unspecified number of placenta and unspecified number of amniotic sacs, unspecified trimester: Secondary | ICD-10-CM | POA: Insufficient documentation

## 2012-04-20 DIAGNOSIS — E669 Obesity, unspecified: Secondary | ICD-10-CM | POA: Insufficient documentation

## 2012-04-20 DIAGNOSIS — O09299 Supervision of pregnancy with other poor reproductive or obstetric history, unspecified trimester: Secondary | ICD-10-CM | POA: Insufficient documentation

## 2012-04-20 DIAGNOSIS — O34219 Maternal care for unspecified type scar from previous cesarean delivery: Secondary | ICD-10-CM

## 2012-04-20 LAB — HEMOGLOBINOPATHY EVALUATION
Hemoglobin Other: 0 %
Hgb A2 Quant: 2.6 % (ref 2.2–3.2)

## 2012-04-21 ENCOUNTER — Ambulatory Visit (HOSPITAL_COMMUNITY): Payer: Self-pay

## 2012-04-22 ENCOUNTER — Encounter: Payer: Self-pay | Admitting: Family Medicine

## 2012-04-22 DIAGNOSIS — D573 Sickle-cell trait: Secondary | ICD-10-CM | POA: Insufficient documentation

## 2012-05-06 ENCOUNTER — Inpatient Hospital Stay (HOSPITAL_COMMUNITY): Payer: Medicaid Other

## 2012-05-06 ENCOUNTER — Inpatient Hospital Stay (HOSPITAL_COMMUNITY)
Admission: AD | Admit: 2012-05-06 | Discharge: 2012-05-06 | Disposition: A | Discharge status: Home / Self Care | Payer: Medicaid Other | Source: Ambulatory Visit | Attending: Obstetrics and Gynecology | Admitting: Obstetrics and Gynecology

## 2012-05-06 ENCOUNTER — Encounter (HOSPITAL_COMMUNITY): Payer: Self-pay | Admitting: *Deleted

## 2012-05-06 DIAGNOSIS — R51 Headache: Secondary | ICD-10-CM | POA: Insufficient documentation

## 2012-05-06 DIAGNOSIS — O30109 Triplet pregnancy, unspecified number of placenta and unspecified number of amniotic sacs, unspecified trimester: Secondary | ICD-10-CM | POA: Insufficient documentation

## 2012-05-06 DIAGNOSIS — R109 Unspecified abdominal pain: Secondary | ICD-10-CM | POA: Insufficient documentation

## 2012-05-06 DIAGNOSIS — O99891 Other specified diseases and conditions complicating pregnancy: Secondary | ICD-10-CM | POA: Insufficient documentation

## 2012-05-06 DIAGNOSIS — O9989 Other specified diseases and conditions complicating pregnancy, childbirth and the puerperium: Secondary | ICD-10-CM

## 2012-05-06 LAB — URINALYSIS, ROUTINE W REFLEX MICROSCOPIC
Bilirubin Urine: NEGATIVE
Leukocytes, UA: NEGATIVE
Nitrite: NEGATIVE
Specific Gravity, Urine: 1.025 (ref 1.005–1.030)
Urobilinogen, UA: 0.2 mg/dL (ref 0.0–1.0)

## 2012-05-06 LAB — WET PREP, GENITAL: Trich, Wet Prep: NONE SEEN

## 2012-05-06 MED ORDER — ACETAMINOPHEN 500 MG PO TABS
1000.0000 mg | ORAL_TABLET | Freq: Four times a day (QID) | ORAL | Status: DC | PRN
  Administered 2012-05-06: 1000 mg via ORAL
  Filled 2012-05-06: qty 2

## 2012-05-06 NOTE — MAU Provider Note (Signed)
History     CSN: [id number]  Arrival date and time: 05/06/12 2104   None     Chief Complaint  Patient presents with  . Headache  . Dysmenorrhea   HPI 32 y.o. [id number] with triplet pregnancy at 13w 4d by today's ultrasound here with headache for 3 days. Took Excedrin first day and it helped some but afraid to take every day. Pain is left side of head behind eye. No nausea/vomiting. No vision changes. Also having lower abdominal cramping last night and some today. More right-sided than left. No bleeding or loss of fluid. States feeling babies move well.  No dysuria but pressure with urination. No vaginal discharge.  Hx preeclampsia first pregnancy and HTN other two pregnancies and between pregnancies. Not on BP medications at this time.    OB History   Grav Para Term Preterm Abortions TAB SAB Ect Mult Living   4 3 3  0 0 0 0 0 0 2      Past Medical History  Diagnosis Date  . Abnormal Pap smear   . Hypertension    Patient Active Problem List  Diagnosis  . HGSIL on Pap smear of cervix  . Encounter for supervision of normal pregnancy in second trimester  . Previous cesarean delivery affecting pregnancy, antepartum  . Hx of preeclampsia, prior pregnancy, currently pregnant  . Sickle cell trait     Past Surgical History  Procedure Laterality Date  . Cesarean section      Family History  Problem Relation Age of Onset  . Diabetes Maternal Grandmother   . Diabetes Paternal Grandmother   . Kidney disease Paternal Grandmother     History  Substance Use Topics  . Smoking status: Former Games developer  . Smokeless tobacco: Never Used  . Alcohol Use: No    Allergies:  Allergies  Allergen Reactions  . Shellfish Allergy     Swelling and Hives    Prescriptions prior to admission  Medication Sig Dispense Refill  . Aspirin-Acetaminophen-Caffeine (EXCEDRIN PO) Take by mouth. migrain      . ondansetron (ZOFRAN) 4 MG tablet Take 1 tablet (4 mg total) by mouth every 8 (eight) hours  as needed for nausea.  30 tablet  2  . Prenatal Multivit-Min-Fe-FA (PRENATAL VITAMINS) 0.8 MG tablet Take 1 tablet by mouth daily.  30 tablet  12  . [DISCONTINUED] promethazine (PHENERGAN) 25 MG tablet Take 1 tablet (25 mg total) by mouth every 6 (six) hours as needed for nausea.  30 tablet  2  . [DISCONTINUED] ranitidine (ZANTAC) 150 MG tablet Take 1 tablet (150 mg total) by mouth 2 (two) times daily.  60 tablet  3    Review of Systems  Constitutional: Negative for fever and chills.  Eyes: Negative for blurred vision and double vision.  Respiratory: Negative for shortness of breath.   Cardiovascular: Negative for chest pain.  Gastrointestinal: Positive for abdominal pain. Negative for nausea, vomiting, diarrhea and constipation.  Genitourinary: Positive for dysuria. Negative for urgency.  Musculoskeletal: Negative for back pain.  Neurological: Positive for headaches. Negative for dizziness.   Physical Exam   Blood pressure 133/67, pulse 86, temperature 98.4 F (36.9 C), temperature source Oral, resp. rate 20, height 5\' 3"  (1.6 m), weight 137.44 kg (303 lb), last menstrual period 01/02/2012, SpO2 100.00%.  Physical Exam  Constitutional: She is oriented to person, place, and time. She appears well-developed and well-nourished. She appears distressed (mild distress due to headache).  HENT:  Head: Normocephalic and atraumatic.  Eyes: Conjunctivae  and EOM are normal.  Neck: Normal range of motion. Neck supple.  Cardiovascular: Normal rate, regular rhythm and normal heart sounds.   Respiratory: Effort normal and breath sounds normal. No respiratory distress.  GI: Soft. Bowel sounds are normal. There is no tenderness. There is no rebound and no guarding.  Genitourinary:  Normal external genitalia. Normal vagina and cervix. No CMT. Moderate white vag discharge. Feels pressure with palpation of R adnexa but no mass.   Musculoskeletal: Normal range of motion. She exhibits no edema and no  tenderness.  Neurological: She is alert and oriented to person, place, and time.  Skin: Skin is warm and dry.  Psychiatric: She has a normal mood and affect.    *RADIOLOGY REPORT*  Clinical Data: Assess viability of triplets.  LIMITED OBSTETRIC ULTRASOUND  Comparison: Ultrasound of pregnancy performed 04/20/2012  Number of Fetuses: 3  Fetus #1  Heart Rate: 155 bpm  Movement: Yes  Presentation: Transverse, head maternal right  Placental Location: Anterior  Previa: No  Amniotic Fluid (Subjective): Normal  Vertical Pocket 4.3 cm  CRL: 7.4 cm 13 w 4 d EDC: 11/07/2012  Fetus #2  Heart Rate: 161 bpm  Movement: Yes  Presentation: Transverse, head maternal left  Placental Location: Posterior  Previa: No  Amniotic Fluid (Subjective): Normal  Vertical Pocket 2.9 cm  CRL: 7.3 cm 13 w 4 d EDC: 11/07/2012  Fetus #3  Heart Rate: 163 bpm  Movement: Yes  Presentation: Cephalic  Placental Location: Anterior  Previa: No  Amniotic Fluid (Subjective): Normal  Vertical Pocket 2.4 cm  CRL: 6.9 cm 13 w 1 d EDC: 11/10/2012  MATERNAL FINDINGS:  Cervix: Closed; measures 3.9 cm in length.  Uterus/Adnexae: The uterus is otherwise unremarkable in  appearance. The ovaries are not assessed on this study, as they  were visualized on the recent prior ultrasound.  IMPRESSION:  Triplet gestation again noted, with intervening membranes as  previously seen. All three fetuses are currently viable; normal  amount of amniotic fluid seen. Crown-rump lengths match the  gestational age of [redacted] weeks 3 days by the prior ultrasound,  reflecting an estimated date of delivery of November 08, 2012.  Recommend followup with non-emergent complete OB 14+ wk US  examination for fetal biometric evaluation and anatomic survey if  not already performed.   Results for orders placed during the hospital encounter of 05/06/12 (from the past 24 hour(s))  URINALYSIS, ROUTINE W REFLEX MICROSCOPIC     Status: None    Collection Time    05/06/12  9:17 PM      Result Value Range   Color, Urine YELLOW  YELLOW   APPearance CLEAR  CLEAR   Specific Gravity, Urine 1.025  1.005 - 1.030   pH 6.0  5.0 - 8.0   Glucose, UA NEGATIVE  NEGATIVE mg/dL   Hgb urine dipstick NEGATIVE  NEGATIVE   Bilirubin Urine NEGATIVE  NEGATIVE   Ketones, ur NEGATIVE  NEGATIVE mg/dL   Protein, ur NEGATIVE  NEGATIVE mg/dL   Urobilinogen, UA 0.2  0.0 - 1.0 mg/dL   Nitrite NEGATIVE  NEGATIVE   Leukocytes, UA NEGATIVE  NEGATIVE  WET PREP, GENITAL     Status: Abnormal   Collection Time    05/06/12 10:55 PM      Result Value Range   Yeast Wet Prep HPF POC NONE SEEN  NONE SEEN   Trich, Wet Prep NONE SEEN  NONE SEEN   Clue Cells Wet Prep HPF POC FEW (*) NONE SEEN  WBC, Wet Prep HPF POC NONE SEEN  NONE SEEN     MAU Course  Procedures   Assessment and Plan  31 y.o. [id number] at [redacted]w[redacted]d by today's ultrasound with triplet pregnancy, viable.  - Headache - resolved with tylenol in MAU.  BP okay today. - Cramping - likely round ligament. Does have mild BV but not c/o discharge. Will wait to treat if symptomatic later in pregnancy. - Discharge home; f/u as scheduled in [facility]. Napoleon Form, MD  Napoleon Form 05/06/2012, 9:57 PM

## 2012-05-06 NOTE — MAU Note (Signed)
Pt states for the last 3 days she has had "thiese little cramps" in her stomach when she is in bed and tries to turn over. Also reports headaches off/on for 3 days. States she took over the counter med and it helped but she didn't want to keep taking meds because of the babies (pt is pregnant with triplets)

## 2012-05-07 LAB — GC/CHLAMYDIA PROBE AMP: GC Probe RNA: NEGATIVE

## 2012-05-10 NOTE — MAU Provider Note (Signed)
Attestation of Attending Supervision of Advanced Practitioner: Evaluation and management procedures were performed by the PA/NP/CNM/OB Fellow under my supervision/collaboration. Chart reviewed and agree with management and plan.  Tilda Burrow 05/10/2012 8:33 PM

## 2012-05-13 ENCOUNTER — Ambulatory Visit (INDEPENDENT_AMBULATORY_CARE_PROVIDER_SITE_OTHER): Payer: Medicaid Other | Admitting: Advanced Practice Midwife

## 2012-05-13 ENCOUNTER — Other Ambulatory Visit: Payer: Self-pay | Admitting: Advanced Practice Midwife

## 2012-05-13 ENCOUNTER — Encounter: Payer: Self-pay | Admitting: Advanced Practice Midwife

## 2012-05-13 VITALS — BP 144/85 | Temp 98.3°F | Wt 317.0 lb

## 2012-05-13 DIAGNOSIS — O0990 Supervision of high risk pregnancy, unspecified, unspecified trimester: Secondary | ICD-10-CM

## 2012-05-13 DIAGNOSIS — O30102 Triplet pregnancy, unspecified number of placenta and unspecified number of amniotic sacs, second trimester: Secondary | ICD-10-CM | POA: Insufficient documentation

## 2012-05-13 DIAGNOSIS — O30109 Triplet pregnancy, unspecified number of placenta and unspecified number of amniotic sacs, unspecified trimester: Secondary | ICD-10-CM

## 2012-05-13 LAB — POCT URINALYSIS DIP (DEVICE)
Bilirubin Urine: NEGATIVE
Glucose, UA: NEGATIVE mg/dL
Ketones, ur: NEGATIVE mg/dL
Leukocytes, UA: NEGATIVE
Protein, ur: NEGATIVE mg/dL

## 2012-05-13 NOTE — Progress Notes (Signed)
Transfer to [facility]. Bring baseline 24 hour urine to lab visit day of [place]. Change ED to 11/08/12 by 11.1 week [place].

## 2012-05-13 NOTE — Progress Notes (Signed)
Pulse- 88  Edema-feet  Pain/pressure- ligament

## 2012-05-13 NOTE — Patient Instructions (Signed)
24-Hour Urine Collection HOME CARE  When you get up in the morning on the day you do this test, pee (urinate) in the toilet and flush. Make a note of the time. This will be your start time on the day of collection and the end time on the next morning.  From then on, save all your pee (urine) in the plastic jug that was given to you.  You should stop collecting your pee 24 hours after you started.  If the plastic jug that is given to you already has liquid in it, that is okay. Do not throw out the liquid or rinse out the jug. Some tests need the liquid to be added to your pee.  Keep your plastic jug cool (in an ice chest or the refrigerator) during the test.  When the 24 hours is over, bring your plastic jug to the clinic lab. Keep the jug cool (in an ice chest) while you are bringing it to the lab. Document Released: 05/17/2008 Document Revised: 05/13/2011 Document Reviewed: 05/17/2008 [facility] Patient Information 2013 [place], [place].  Pregnancy - Second Trimester The second trimester of pregnancy (3 to 6 months) is a period of rapid growth for you and your baby. At the end of the sixth month, your baby is about 9 inches long and weighs 1 1/2 pounds. You will begin to feel the baby move between 18 and 20 weeks of the pregnancy. This is called quickening. Weight gain is faster. A clear fluid (colostrum) may leak out of your breasts. You may feel small contractions of the womb (uterus). This is known as false labor or Braxton-Hicks contractions. This is like a practice for labor when the baby is ready to be born. Usually, the problems with morning sickness have usually passed by the end of your first trimester. Some women develop small dark blotches (called cholasma, mask of pregnancy) on their face that usually goes away after the baby is born. Exposure to the sun makes the blotches worse. Acne may also develop in some pregnant women and pregnant women who have acne, may find that it goes  away. PRENATAL EXAMS  Blood work may continue to be done during prenatal exams. These tests are done to check on your health and the probable health of your baby. Blood work is used to follow your blood levels (hemoglobin). Anemia (low hemoglobin) is common during pregnancy. Iron and vitamins are given to help prevent this. You will also be checked for diabetes between 24 and 28 weeks of the pregnancy. Some of the previous blood tests may be repeated.  The size of the uterus is measured during each visit. This is to make sure that the baby is continuing to grow properly according to the dates of the pregnancy.  Your blood pressure is checked every prenatal visit. This is to make sure you are not getting toxemia.  Your urine is checked to make sure you do not have an infection, diabetes or protein in the urine.  Your weight is checked often to make sure gains are happening at the suggested rate. This is to ensure that both you and your baby are growing normally.  Sometimes, an ultrasound is performed to confirm the proper growth and development of the baby. This is a test which bounces harmless sound waves off the baby so your caregiver can more accurately determine due dates. Sometimes, a specialized test is done on the amniotic fluid surrounding the baby. This test is called an amniocentesis. The amniotic fluid  is obtained by sticking a needle into the belly (abdomen). This is done to check the chromosomes in instances where there is a concern about possible genetic problems with the baby. It is also sometimes done near the end of pregnancy if an early delivery is required. In this case, it is done to help make sure the baby's lungs are mature enough for the baby to live outside of the womb. CHANGES OCCURING IN THE SECOND TRIMESTER OF PREGNANCY Your body goes through many changes during pregnancy. They vary from person to person. Talk to your caregiver about changes you notice that you are concerned  about.  During the second trimester, you will likely have an increase in your appetite. It is normal to have cravings for certain foods. This varies from person to person and pregnancy to pregnancy.  Your lower abdomen will begin to bulge.  You may have to urinate more often because the uterus and baby are pressing on your bladder. It is also common to get more bladder infections during pregnancy (pain with urination). You can help this by drinking lots of fluids and emptying your bladder before and after intercourse.  You may begin to get stretch marks on your hips, abdomen, and breasts. These are normal changes in the body during pregnancy. There are no exercises or medications to take that prevent this change.  You may begin to develop swollen and bulging veins (varicose veins) in your legs. Wearing support hose, elevating your feet for 15 minutes, 3 to 4 times a day and limiting salt in your diet helps lessen the problem.  Heartburn may develop as the uterus grows and pushes up against the stomach. Antacids recommended by your caregiver helps with this problem. Also, eating smaller meals 4 to 5 times a day helps.  Constipation can be treated with a stool softener or adding bulk to your diet. Drinking lots of fluids, vegetables, fruits, and whole grains are helpful.  Exercising is also helpful. If you have been very active up until your pregnancy, most of these activities can be continued during your pregnancy. If you have been less active, it is helpful to start an exercise program such as walking.  Hemorrhoids (varicose veins in the rectum) may develop at the end of the second trimester. Warm sitz baths and hemorrhoid cream recommended by your caregiver helps hemorrhoid problems.  Backaches may develop during this time of your pregnancy. Avoid heavy lifting, wear low heal shoes and practice good posture to help with backache problems.  Some pregnant women develop tingling and numbness of  their hand and fingers because of swelling and tightening of ligaments in the wrist (carpel tunnel syndrome). This goes away after the baby is born.  As your breasts enlarge, you may have to get a bigger bra. Get a comfortable, cotton, support bra. Do not get a nursing bra until the last month of the pregnancy if you will be nursing the baby.  You may get a dark line from your belly button to the pubic area called the linea nigra.  You may develop rosy cheeks because of increase blood flow to the face.  You may develop spider looking lines of the face, neck, arms and chest. These go away after the baby is born. HOME CARE INSTRUCTIONS   It is extremely important to avoid all smoking, herbs, alcohol, and unprescribed drugs during your pregnancy. These chemicals affect the formation and growth of the baby. Avoid these chemicals throughout the pregnancy to ensure the delivery  of a healthy infant.  Most of your home care instructions are the same as suggested for the first trimester of your pregnancy. Keep your caregiver's appointments. Follow your caregiver's instructions regarding medication use, exercise and diet.  During pregnancy, you are providing food for you and your baby. Continue to eat regular, well-balanced meals. Choose foods such as meat, fish, milk and other low fat dairy products, vegetables, fruits, and whole-grain breads and cereals. Your caregiver will tell you of the ideal weight gain.  A physical sexual relationship may be continued up until near the end of pregnancy if there are no other problems. Problems could include early (premature) leaking of amniotic fluid from the membranes, vaginal bleeding, abdominal pain, or other medical or pregnancy problems.  Exercise regularly if there are no restrictions. Check with your caregiver if you are unsure of the safety of some of your exercises. The greatest weight gain will occur in the last 2 trimesters of pregnancy. Exercise will help  you:  Control your weight.  Get you in shape for labor and delivery.  Lose weight after you have the baby.  Wear a good support or jogging bra for breast tenderness during pregnancy. This may help if worn during sleep. Pads or tissues may be used in the bra if you are leaking colostrum.  Do not use hot tubs, steam rooms or saunas throughout the pregnancy.  Wear your seat belt at all times when driving. This protects you and your baby if you are in an accident.  Avoid raw meat, uncooked cheese, cat litter boxes and soil used by cats. These carry germs that can cause birth defects in the baby.  The second trimester is also a good time to visit your dentist for your dental health if this has not been done yet. Getting your teeth cleaned is OK. Use a soft toothbrush. Brush gently during pregnancy.  It is easier to loose urine during pregnancy. Tightening up and strengthening the pelvic muscles will help with this problem. Practice stopping your urination while you are going to the bathroom. These are the same muscles you need to strengthen. It is also the muscles you would use as if you were trying to stop from passing gas. You can practice tightening these muscles up 10 times a set and repeating this about 3 times per day. Once you know what muscles to tighten up, do not perform these exercises during urination. It is more likely to contribute to an infection by backing up the urine.  Ask for help if you have financial, counseling or nutritional needs during pregnancy. Your caregiver will be able to offer counseling for these needs as well as refer you for other special needs.  Your skin may become oily. If so, wash your face with mild soap, use non-greasy moisturizer and oil or cream based makeup. MEDICATIONS AND DRUG USE IN PREGNANCY  Take prenatal vitamins as directed. The vitamin should contain 1 milligram of folic acid. Keep all vitamins out of reach of children. Only a couple vitamins or  tablets containing iron may be fatal to a baby or young child when ingested.  Avoid use of all medications, including herbs, over-the-counter medications, not prescribed or suggested by your caregiver. Only take over-the-counter or prescription medicines for pain, discomfort, or fever as directed by your caregiver. Do not use aspirin.  Let your caregiver also know about herbs you may be using.  Alcohol is related to a number of birth defects. This includes fetal alcohol syndrome.  All alcohol, in any form, should be avoided completely. Smoking will cause low birth rate and premature babies.  Street or illegal drugs are very harmful to the baby. They are absolutely forbidden. A baby born to an addicted mother will be addicted at birth. The baby will go through the same withdrawal an adult does. SEEK MEDICAL CARE IF:  You have any concerns or worries during your pregnancy. It is better to call with your questions if you feel they cannot wait, rather than worry about them. SEEK IMMEDIATE MEDICAL CARE IF:   An unexplained oral temperature above 102 F (38.9 C) develops, or as your caregiver suggests.  You have leaking of fluid from the vagina (birth canal). If leaking membranes are suspected, take your temperature and tell your caregiver of this when you call.  There is vaginal spotting, bleeding, or passing clots. Tell your caregiver of the amount and how many pads are used. Light spotting in pregnancy is common, especially following intercourse.  You develop a bad smelling vaginal discharge with a change in the color from clear to white.  You continue to feel sick to your stomach (nauseated) and have no relief from remedies suggested. You vomit blood or coffee ground-like materials.  You lose more than 2 pounds of weight or gain more than 2 pounds of weight over 1 week, or as suggested by your caregiver.  You notice swelling of your face, hands, feet, or legs.  You get exposed to [place]  measles and have never had them.  You are exposed to fifth disease or chickenpox.  You develop belly (abdominal) pain. Round ligament discomfort is a common non-cancerous (benign) cause of abdominal pain in pregnancy. Your caregiver still must evaluate you.  You develop a bad headache that does not go away.  You develop fever, diarrhea, pain with urination, or shortness of breath.  You develop visual problems, blurry, or double vision.  You fall or are in a car accident or any kind of trauma.  There is mental or physical violence at home. Document Released: 02/12/2001 Document Revised: 05/13/2011 Document Reviewed: 08/17/2008 [facility] Patient Information 2013 [place], [place].

## 2012-05-28 ENCOUNTER — Ambulatory Visit (INDEPENDENT_AMBULATORY_CARE_PROVIDER_SITE_OTHER): Payer: Medicaid Other | Admitting: Obstetrics & Gynecology

## 2012-05-28 ENCOUNTER — Encounter: Payer: Self-pay | Admitting: Obstetrics & Gynecology

## 2012-05-28 VITALS — BP 155/98 | Temp 98.1°F | Wt 321.3 lb

## 2012-05-28 DIAGNOSIS — O30102 Triplet pregnancy, unspecified number of placenta and unspecified number of amniotic sacs, second trimester: Secondary | ICD-10-CM

## 2012-05-28 DIAGNOSIS — O10919 Unspecified pre-existing hypertension complicating pregnancy, unspecified trimester: Secondary | ICD-10-CM | POA: Insufficient documentation

## 2012-05-28 DIAGNOSIS — O30109 Triplet pregnancy, unspecified number of placenta and unspecified number of amniotic sacs, unspecified trimester: Secondary | ICD-10-CM

## 2012-05-28 DIAGNOSIS — O0990 Supervision of high risk pregnancy, unspecified, unspecified trimester: Secondary | ICD-10-CM

## 2012-05-28 DIAGNOSIS — O10912 Unspecified pre-existing hypertension complicating pregnancy, second trimester: Secondary | ICD-10-CM

## 2012-05-28 DIAGNOSIS — O10019 Pre-existing essential hypertension complicating pregnancy, unspecified trimester: Secondary | ICD-10-CM

## 2012-05-28 LAB — COMPREHENSIVE METABOLIC PANEL
ALT: 31 U/L (ref 0–35)
Albumin: 3.1 g/dL — ABNORMAL LOW (ref 3.5–5.2)
CO2: 24 mEq/L (ref 19–32)
Potassium: 3.7 mEq/L (ref 3.5–5.3)
Sodium: 134 mEq/L — ABNORMAL LOW (ref 135–145)
Total Bilirubin: 0.4 mg/dL (ref 0.3–1.2)
Total Protein: 5.9 g/dL — ABNORMAL LOW (ref 6.0–8.3)

## 2012-05-28 MED ORDER — METOPROLOL TARTRATE 25 MG PO TABS: 25.0000 mg | ORAL_TABLET | Freq: Two times a day (BID) | ORAL | Status: DC

## 2012-05-28 NOTE — Progress Notes (Signed)
Some movement. Hypertension noted. Start Metoprolol 25 mg bid. RTC 2 weeks F/U after [place]. CMP today

## 2012-05-28 NOTE — Addendum Note (Signed)
Addended by: Franchot Mimes on: 05/28/2012 11:40 AM   Modules accepted: Orders

## 2012-05-28 NOTE — Patient Instructions (Signed)

## 2012-05-28 NOTE — Progress Notes (Signed)
Patient c/o pain in her naval area.  Reports having a headache and has a BP = 155/98.  Has hx of preeclampsia.

## 2012-05-29 LAB — CREATININE CLEARANCE, URINE, 24 HOUR: Creatinine: 0.65 mg/dL (ref 0.50–1.10)

## 2012-05-29 LAB — PROTEIN, URINE, 24 HOUR
Protein, 24H Urine: 61 mg/d (ref 50–100)
Protein, Urine: 4 mg/dL

## 2012-06-04 ENCOUNTER — Ambulatory Visit (INDEPENDENT_AMBULATORY_CARE_PROVIDER_SITE_OTHER): Payer: Self-pay | Admitting: Advanced Practice Midwife

## 2012-06-04 ENCOUNTER — Other Ambulatory Visit: Payer: Self-pay | Admitting: Obstetrics & Gynecology

## 2012-06-04 VITALS — BP 115/68 | Wt 321.6 lb

## 2012-06-04 DIAGNOSIS — O09299 Supervision of pregnancy with other poor reproductive or obstetric history, unspecified trimester: Secondary | ICD-10-CM

## 2012-06-04 LAB — POCT URINALYSIS DIP (DEVICE)
Bilirubin Urine: NEGATIVE
Ketones, ur: NEGATIVE mg/dL
pH: 6.5 (ref 5.0–8.0)

## 2012-06-04 MED ORDER — DOCUSATE SODIUM 100 MG PO CAPS: 100.0000 mg | ORAL_CAPSULE | Freq: Two times a day (BID) | ORAL | Status: DC | PRN

## 2012-06-04 NOTE — Progress Notes (Signed)
Pulse: 86 Has not started metoprolol. Complains of constipation.

## 2012-06-04 NOTE — Progress Notes (Signed)
Doing well.  Good fetal movement, denies vaginal bleeding, LOF, regular contractions.  Reports problems with constipation.  Discussed high fiber foods, colace.  Pt not taking metoprolol yet, normal BP today, recheck BP in 1 week.  Prenatal visit in 2 weeks, following U/S.

## 2012-06-10 ENCOUNTER — Encounter (HOSPITAL_COMMUNITY): Payer: Self-pay | Admitting: Advanced Practice Midwife

## 2012-06-11 ENCOUNTER — Other Ambulatory Visit: Payer: Self-pay | Admitting: Obstetrics & Gynecology

## 2012-06-11 ENCOUNTER — Encounter: Payer: Self-pay | Admitting: Obstetrics and Gynecology

## 2012-06-11 LAB — POCT URINALYSIS DIP (DEVICE)
Glucose, UA: NEGATIVE mg/dL
Ketones, ur: NEGATIVE mg/dL
Specific Gravity, Urine: 1.015 (ref 1.005–1.030)

## 2012-06-17 ENCOUNTER — Encounter (HOSPITAL_COMMUNITY): Payer: Self-pay

## 2012-06-17 ENCOUNTER — Ambulatory Visit (HOSPITAL_COMMUNITY)
Admission: RE | Admit: 2012-06-17 | Discharge: 2012-06-17 | Disposition: A | Discharge status: Home / Self Care | Payer: Medicaid Other | Source: Ambulatory Visit | Attending: Advanced Practice Midwife | Admitting: Advanced Practice Midwife

## 2012-06-17 DIAGNOSIS — O358XX0 Maternal care for other (suspected) fetal abnormality and damage, not applicable or unspecified: Secondary | ICD-10-CM | POA: Insufficient documentation

## 2012-06-17 DIAGNOSIS — O99210 Obesity complicating pregnancy, unspecified trimester: Secondary | ICD-10-CM | POA: Insufficient documentation

## 2012-06-17 DIAGNOSIS — Z363 Encounter for antenatal screening for malformations: Secondary | ICD-10-CM | POA: Insufficient documentation

## 2012-06-17 DIAGNOSIS — O09299 Supervision of pregnancy with other poor reproductive or obstetric history, unspecified trimester: Secondary | ICD-10-CM | POA: Insufficient documentation

## 2012-06-17 DIAGNOSIS — O10019 Pre-existing essential hypertension complicating pregnancy, unspecified trimester: Secondary | ICD-10-CM | POA: Insufficient documentation

## 2012-06-17 DIAGNOSIS — Z1389 Encounter for screening for other disorder: Secondary | ICD-10-CM | POA: Insufficient documentation

## 2012-06-17 DIAGNOSIS — O30102 Triplet pregnancy, unspecified number of placenta and unspecified number of amniotic sacs, second trimester: Secondary | ICD-10-CM

## 2012-06-17 DIAGNOSIS — O34219 Maternal care for unspecified type scar from previous cesarean delivery: Secondary | ICD-10-CM | POA: Insufficient documentation

## 2012-06-17 DIAGNOSIS — O30109 Triplet pregnancy, unspecified number of placenta and unspecified number of amniotic sacs, unspecified trimester: Secondary | ICD-10-CM | POA: Insufficient documentation

## 2012-06-17 DIAGNOSIS — E669 Obesity, unspecified: Secondary | ICD-10-CM | POA: Insufficient documentation

## 2012-06-17 NOTE — ED Notes (Signed)
Pt reports she is still not taking her BP meds.  Her MD is aware.

## 2012-06-18 ENCOUNTER — Encounter: Payer: Self-pay | Admitting: Obstetrics and Gynecology

## 2012-06-18 ENCOUNTER — Encounter: Payer: Self-pay | Admitting: Advanced Practice Midwife

## 2012-06-19 ENCOUNTER — Inpatient Hospital Stay (HOSPITAL_COMMUNITY)
Admission: AD | Admit: 2012-06-19 | Discharge: 2012-06-19 | Disposition: A | Discharge status: Home / Self Care | Payer: Medicaid Other | Source: Ambulatory Visit | Attending: Family Medicine | Admitting: Family Medicine

## 2012-06-19 ENCOUNTER — Encounter (HOSPITAL_COMMUNITY): Payer: Self-pay

## 2012-06-19 DIAGNOSIS — R51 Headache: Secondary | ICD-10-CM | POA: Insufficient documentation

## 2012-06-19 DIAGNOSIS — O2390 Unspecified genitourinary tract infection in pregnancy, unspecified trimester: Secondary | ICD-10-CM | POA: Insufficient documentation

## 2012-06-19 DIAGNOSIS — O2342 Unspecified infection of urinary tract in pregnancy, second trimester: Secondary | ICD-10-CM

## 2012-06-19 DIAGNOSIS — N390 Urinary tract infection, site not specified: Secondary | ICD-10-CM | POA: Insufficient documentation

## 2012-06-19 DIAGNOSIS — O30109 Triplet pregnancy, unspecified number of placenta and unspecified number of amniotic sacs, unspecified trimester: Secondary | ICD-10-CM | POA: Insufficient documentation

## 2012-06-19 DIAGNOSIS — R109 Unspecified abdominal pain: Secondary | ICD-10-CM | POA: Insufficient documentation

## 2012-06-19 LAB — URINE MICROSCOPIC-ADD ON

## 2012-06-19 LAB — URINALYSIS, ROUTINE W REFLEX MICROSCOPIC
Glucose, UA: NEGATIVE mg/dL
pH: 6 (ref 5.0–8.0)

## 2012-06-19 MED ORDER — CEPHALEXIN 500 MG PO CAPS: 500.0000 mg | ORAL_CAPSULE | Freq: Four times a day (QID) | ORAL | Status: DC

## 2012-06-19 MED ORDER — CEPHALEXIN 500 MG PO CAPS
500.0000 mg | ORAL_CAPSULE | Freq: Once | ORAL | Status: AC
  Administered 2012-06-19: 500 mg via ORAL
  Filled 2012-06-19: qty 1

## 2012-06-19 MED ORDER — FAMOTIDINE 20 MG PO TABS
20.0000 mg | ORAL_TABLET | Freq: Once | ORAL | Status: AC
  Administered 2012-06-19: 20 mg via ORAL
  Filled 2012-06-19: qty 1

## 2012-06-19 MED ORDER — ACETAMINOPHEN 500 MG PO TABS
1000.0000 mg | ORAL_TABLET | Freq: Once | ORAL | Status: AC
  Administered 2012-06-19: 1000 mg via ORAL
  Filled 2012-06-19: qty 2

## 2012-06-19 MED ORDER — FAMOTIDINE 10 MG PO CHEW: 20.0000 mg | CHEWABLE_TABLET | Freq: Two times a day (BID) | ORAL | Status: DC

## 2012-06-19 NOTE — MAU Note (Signed)
Patient presents with headache from a bad tooth getting worse, cramping in lower abdomen just started this afternoon.

## 2012-06-19 NOTE — MAU Provider Note (Signed)
History     CSN: [id number]  Arrival date and time: 06/19/12 2026   None     Chief Complaint  Patient presents with  . Headache  . Abdominal Pain   HPI  Jenny Mann is a 32 y.o. [id number] at [redacted]w[redacted]d with a triplet pregnancy. She states that she has been having lower abdominal cramping for a "few hours". She denies any LOF or VB or vaginal discharge. She has been feeling the babies move. She has not taken anything for the pain. She is also having headache from a bad tooth. She would like a note so that she can go to the dentist to have the tooth pulled.   Past Medical History  Diagnosis Date  . Abnormal Pap smear   . Hypertension     Past Surgical History  Procedure Laterality Date  . Cesarean section      Family History  Problem Relation Age of Onset  . Diabetes Maternal Grandmother   . Diabetes Paternal Grandmother   . Kidney disease Paternal Grandmother     History  Substance Use Topics  . Smoking status: Former Games developer  . Smokeless tobacco: Never Used  . Alcohol Use: No    Allergies:  Allergies  Allergen Reactions  . Shellfish Allergy Anaphylaxis    Prescriptions prior to admission  Medication Sig Dispense Refill  . Prenatal Multivit-Min-Fe-FA (PRENATAL VITAMINS) 0.8 MG tablet Take 1 tablet by mouth daily.  30 tablet  12  . docusate sodium (COLACE) 100 MG capsule Take 1 capsule (100 mg total) by mouth 2 (two) times daily as needed for constipation.  30 capsule  2  . metoprolol tartrate (LOPRESSOR) 25 MG tablet Take 1 tablet (25 mg total) by mouth 2 (two) times daily.  60 tablet  2    Review of Systems  Constitutional: Negative for fever and chills.  Eyes: Negative for blurred vision.  Respiratory: Negative for shortness of breath.   Cardiovascular: Negative for chest pain.  Gastrointestinal: Positive for abdominal pain. Negative for nausea, vomiting, diarrhea and constipation.  Genitourinary: Positive for frequency. Negative for dysuria and urgency.   Musculoskeletal: Negative for myalgias.  Neurological: Positive for headaches. Negative for dizziness.   Physical Exam   Blood pressure 124/50, pulse 85, temperature 98.5 F (36.9 C), temperature source Oral, resp. rate 18, last menstrual period 01/02/2012.  Physical Exam  Nursing note and vitals reviewed. Constitutional: She is oriented to person, place, and time. She appears well-developed. No distress.  Cardiovascular: Normal rate.   Respiratory: Effort normal.  GI: Soft. She exhibits no distension. There is no tenderness.  Neurological: She is alert and oriented to person, place, and time.  Skin: Skin is warm and dry.  Psychiatric: She has a normal mood and affect.    MAU Course  Procedures  Results for orders placed during the hospital encounter of 06/19/12 (from the past 24 hour(s))  URINALYSIS, ROUTINE W REFLEX MICROSCOPIC     Status: Abnormal   Collection Time    06/19/12  8:35 PM      Result Value Range   Color, Urine YELLOW  YELLOW   APPearance CLEAR  CLEAR   Specific Gravity, Urine 1.025  1.005 - 1.030   pH 6.0  5.0 - 8.0   Glucose, UA NEGATIVE  NEGATIVE mg/dL   Hgb urine dipstick NEGATIVE  NEGATIVE   Bilirubin Urine NEGATIVE  NEGATIVE   Ketones, ur NEGATIVE  NEGATIVE mg/dL   Protein, ur NEGATIVE  NEGATIVE mg/dL   Urobilinogen,  UA 0.2  0.0 - 1.0 mg/dL   Nitrite POSITIVE (*) NEGATIVE   Leukocytes, UA MODERATE (*) NEGATIVE  URINE MICROSCOPIC-ADD ON     Status: Abnormal   Collection Time    06/19/12  8:35 PM      Result Value Range   Squamous Epithelial / LPF RARE  RARE   WBC, UA 7-10  <3 WBC/hpf   RBC / HPF 3-6  <3 RBC/hpf   Bacteria, UA MANY (*) RARE     Assessment and Plan   1. UTI in pregnancy, antepartum, second trimester    Kelfex 500mg  QID #27 First dose given here in MAU Note given for the dentist FU as scheduled in the clinic.   Tawnya Crook 06/19/2012, 9:22 PM

## 2012-06-20 NOTE — MAU Provider Note (Signed)
Chart reviewed and agree with management and plan.  

## 2012-06-21 LAB — URINE CULTURE: Colony Count: 50000

## 2012-07-02 ENCOUNTER — Ambulatory Visit (INDEPENDENT_AMBULATORY_CARE_PROVIDER_SITE_OTHER): Payer: Medicaid Other | Admitting: Family Medicine

## 2012-07-02 ENCOUNTER — Encounter: Payer: Self-pay | Admitting: Family Medicine

## 2012-07-02 VITALS — BP 130/83 | Temp 97.5°F | Wt 328.6 lb

## 2012-07-02 DIAGNOSIS — O10019 Pre-existing essential hypertension complicating pregnancy, unspecified trimester: Secondary | ICD-10-CM

## 2012-07-02 DIAGNOSIS — O10912 Unspecified pre-existing hypertension complicating pregnancy, second trimester: Secondary | ICD-10-CM

## 2012-07-02 DIAGNOSIS — Z3492 Encounter for supervision of normal pregnancy, unspecified, second trimester: Secondary | ICD-10-CM

## 2012-07-02 DIAGNOSIS — O30109 Triplet pregnancy, unspecified number of placenta and unspecified number of amniotic sacs, unspecified trimester: Secondary | ICD-10-CM

## 2012-07-02 DIAGNOSIS — O30102 Triplet pregnancy, unspecified number of placenta and unspecified number of amniotic sacs, second trimester: Secondary | ICD-10-CM

## 2012-07-02 NOTE — Progress Notes (Signed)
P=90, c/o edema in ankles, states been up a lot because moving.

## 2012-07-02 NOTE — Progress Notes (Signed)
Latest U/S shows A-vtx-277 gms 37%, B trv 299 gms 45%, C trv 274 gms 36%--all doing well--having some trouble sleeping. Desires permanent sterilization with C-section. Needs to sign papers, with medicaid card when arrives.

## 2012-07-02 NOTE — Patient Instructions (Addendum)
Pregnancy - Second Trimester The second trimester of pregnancy (3 to 6 months) is a period of rapid growth for you and your baby. At the end of the sixth month, your baby is about 9 inches long and weighs 1 1/2 pounds. You will begin to feel the baby move between 18 and 20 weeks of the pregnancy. This is called quickening. Weight gain is faster. A clear fluid (colostrum) may leak out of your breasts. You may feel small contractions of the womb (uterus). This is known as false labor or Braxton-Hicks contractions. This is like a practice for labor when the baby is ready to be born. Usually, the problems with morning sickness have usually passed by the end of your first trimester. Some women develop small dark blotches (called cholasma, mask of pregnancy) on their face that usually goes away after the baby is born. Exposure to the sun makes the blotches worse. Acne may also develop in some pregnant women and pregnant women who have acne, may find that it goes away. PRENATAL EXAMS  Blood work may continue to be done during prenatal exams. These tests are done to check on your health and the probable health of your baby. Blood work is used to follow your blood levels (hemoglobin). Anemia (low hemoglobin) is common during pregnancy. Iron and vitamins are given to help prevent this. You will also be checked for diabetes between 24 and 28 weeks of the pregnancy. Some of the previous blood tests may be repeated.  The size of the uterus is measured during each visit. This is to make sure that the baby is continuing to grow properly according to the dates of the pregnancy.  Your blood pressure is checked every prenatal visit. This is to make sure you are not getting toxemia.  Your urine is checked to make sure you do not have an infection, diabetes or protein in the urine.  Your weight is checked often to make sure gains are happening at the suggested rate. This is to ensure that both you and your baby are  growing normally.  Sometimes, an ultrasound is performed to confirm the proper growth and development of the baby. This is a test which bounces harmless sound waves off the baby so your caregiver can more accurately determine due dates. Sometimes, a specialized test is done on the amniotic fluid surrounding the baby. This test is called an amniocentesis. The amniotic fluid is obtained by sticking a needle into the belly (abdomen). This is done to check the chromosomes in instances where there is a concern about possible genetic problems with the baby. It is also sometimes done near the end of pregnancy if an early delivery is required. In this case, it is done to help make sure the baby's lungs are mature enough for the baby to live outside of the womb. CHANGES OCCURING IN THE SECOND TRIMESTER OF PREGNANCY Your body goes through many changes during pregnancy. They vary from person to person. Talk to your caregiver about changes you notice that you are concerned about.  During the second trimester, you will likely have an increase in your appetite. It is normal to have cravings for certain foods. This varies from person to person and pregnancy to pregnancy.  Your lower abdomen will begin to bulge.  You may have to urinate more often because the uterus and baby are pressing on your bladder. It is also common to get more bladder infections during pregnancy (pain with urination). You can help this by  drinking lots of fluids and emptying your bladder before and after intercourse.  You may begin to get stretch marks on your hips, abdomen, and breasts. These are normal changes in the body during pregnancy. There are no exercises or medications to take that prevent this change.  You may begin to develop swollen and bulging veins (varicose veins) in your legs. Wearing support hose, elevating your feet for 15 minutes, 3 to 4 times a day and limiting salt in your diet helps lessen the problem.  Heartburn may  develop as the uterus grows and pushes up against the stomach. Antacids recommended by your caregiver helps with this problem. Also, eating smaller meals 4 to 5 times a day helps.  Constipation can be treated with a stool softener or adding bulk to your diet. Drinking lots of fluids, vegetables, fruits, and whole grains are helpful.  Exercising is also helpful. If you have been very active up until your pregnancy, most of these activities can be continued during your pregnancy. If you have been less active, it is helpful to start an exercise program such as walking.  Hemorrhoids (varicose veins in the rectum) may develop at the end of the second trimester. Warm sitz baths and hemorrhoid cream recommended by your caregiver helps hemorrhoid problems.  Backaches may develop during this time of your pregnancy. Avoid heavy lifting, wear low heal shoes and practice good posture to help with backache problems.  Some pregnant women develop tingling and numbness of their hand and fingers because of swelling and tightening of ligaments in the wrist (carpel tunnel syndrome). This goes away after the baby is born.  As your breasts enlarge, you may have to get a bigger bra. Get a comfortable, cotton, support bra. Do not get a nursing bra until the last month of the pregnancy if you will be nursing the baby.  You may get a dark line from your belly button to the pubic area called the linea nigra.  You may develop rosy cheeks because of increase blood flow to the face.  You may develop spider looking lines of the face, neck, arms and chest. These go away after the baby is born. HOME CARE INSTRUCTIONS   It is extremely important to avoid all smoking, herbs, alcohol, and unprescribed drugs during your pregnancy. These chemicals affect the formation and growth of the baby. Avoid these chemicals throughout the pregnancy to ensure the delivery of a healthy infant.  Most of your home care instructions are the same  as suggested for the first trimester of your pregnancy. Keep your caregiver's appointments. Follow your caregiver's instructions regarding medication use, exercise and diet.  During pregnancy, you are providing food for you and your baby. Continue to eat regular, well-balanced meals. Choose foods such as meat, fish, milk and other low fat dairy products, vegetables, fruits, and whole-grain breads and cereals. Your caregiver will tell you of the ideal weight gain.  A physical sexual relationship may be continued up until near the end of pregnancy if there are no other problems. Problems could include early (premature) leaking of amniotic fluid from the membranes, vaginal bleeding, abdominal pain, or other medical or pregnancy problems.  Exercise regularly if there are no restrictions. Check with your caregiver if you are unsure of the safety of some of your exercises. The greatest weight gain will occur in the last 2 trimesters of pregnancy. Exercise will help you:  Control your weight.  Get you in shape for labor and delivery.  Lose weight  after you have the baby.  Wear a good support or jogging bra for breast tenderness during pregnancy. This may help if worn during sleep. Pads or tissues may be used in the bra if you are leaking colostrum.  Do not use hot tubs, steam rooms or saunas throughout the pregnancy.  Wear your seat belt at all times when driving. This protects you and your baby if you are in an accident.  Avoid raw meat, uncooked cheese, cat litter boxes and soil used by cats. These carry germs that can cause birth defects in the baby.  The second trimester is also a good time to visit your dentist for your dental health if this has not been done yet. Getting your teeth cleaned is OK. Use a soft toothbrush. Brush gently during pregnancy.  It is easier to loose urine during pregnancy. Tightening up and strengthening the pelvic muscles will help with this problem. Practice stopping  your urination while you are going to the bathroom. These are the same muscles you need to strengthen. It is also the muscles you would use as if you were trying to stop from passing gas. You can practice tightening these muscles up 10 times a set and repeating this about 3 times per day. Once you know what muscles to tighten up, do not perform these exercises during urination. It is more likely to contribute to an infection by backing up the urine.  Ask for help if you have financial, counseling or nutritional needs during pregnancy. Your caregiver will be able to offer counseling for these needs as well as refer you for other special needs.  Your skin may become oily. If so, wash your face with mild soap, use non-greasy moisturizer and oil or cream based makeup. MEDICATIONS AND DRUG USE IN PREGNANCY  Take prenatal vitamins as directed. The vitamin should contain 1 milligram of folic acid. Keep all vitamins out of reach of children. Only a couple vitamins or tablets containing iron may be fatal to a baby or young child when ingested.  Avoid use of all medications, including herbs, over-the-counter medications, not prescribed or suggested by your caregiver. Only take over-the-counter or prescription medicines for pain, discomfort, or fever as directed by your caregiver. Do not use aspirin.  Let your caregiver also know about herbs you may be using.  Alcohol is related to a number of birth defects. This includes fetal alcohol syndrome. All alcohol, in any form, should be avoided completely. Smoking will cause low birth rate and premature babies.  Street or illegal drugs are very harmful to the baby. They are absolutely forbidden. A baby born to an addicted mother will be addicted at birth. The baby will go through the same withdrawal an adult does. SEEK MEDICAL CARE IF:  You have any concerns or worries during your pregnancy. It is better to call with your questions if you feel they cannot wait,  rather than worry about them. SEEK IMMEDIATE MEDICAL CARE IF:   An unexplained oral temperature above 102 F (38.9 C) develops, or as your caregiver suggests.  You have leaking of fluid from the vagina (birth canal). If leaking membranes are suspected, take your temperature and tell your caregiver of this when you call.  There is vaginal spotting, bleeding, or passing clots. Tell your caregiver of the amount and how many pads are used. Light spotting in pregnancy is common, especially following intercourse.  You develop a bad smelling vaginal discharge with a change in the color from clear  to white.  You continue to feel sick to your stomach (nauseated) and have no relief from remedies suggested. You vomit blood or coffee ground-like materials.  You lose more than 2 pounds of weight or gain more than 2 pounds of weight over 1 week, or as suggested by your caregiver.  You notice swelling of your face, hands, feet, or legs.  You get exposed to [place] measles and have never had them.  You are exposed to fifth disease or chickenpox.  You develop belly (abdominal) pain. Round ligament discomfort is a common non-cancerous (benign) cause of abdominal pain in pregnancy. Your caregiver still must evaluate you.  You develop a bad headache that does not go away.  You develop fever, diarrhea, pain with urination, or shortness of breath.  You develop visual problems, blurry, or double vision.  You fall or are in a car accident or any kind of trauma.  There is mental or physical violence at home. Document Released: 02/12/2001 Document Revised: 05/13/2011 Document Reviewed: 08/17/2008 [facility] Patient Information 2013 [place], [place].  Breastfeeding Deciding to breastfeed is one of the best choices you can make for you and your baby. The information that follows gives a brief overview of the benefits of breastfeeding as well as common topics surrounding breastfeeding. BENEFITS OF  BREASTFEEDING For the baby  The first milk (colostrum) helps the baby's digestive system function better.   There are antibodies in the mother's milk that help the baby fight off infections.   The baby has a lower incidence of asthma, allergies, and sudden infant death syndrome (SIDS).   The nutrients in breast milk are better for the baby than infant formulas, and breast milk helps the baby's brain grow better.   Babies who breastfeed have less gas, colic, and constipation.  For the mother  Breastfeeding helps develop a very special bond between the mother and her baby.   Breastfeeding is convenient, always available at the correct temperature, and costs nothing.   Breastfeeding burns calories in the mother and helps her lose weight that was gained during pregnancy.   Breastfeeding makes the uterus contract back down to normal size faster and slows bleeding following delivery.   Breastfeeding mothers have a lower risk of developing breast cancer.  BREASTFEEDING FREQUENCY  A healthy, full-term baby may breastfeed as often as every hour or space his or her feedings to every 3 hours.   Watch your baby for signs of hunger. Nurse your baby if he or she shows signs of hunger. How often you nurse will vary from baby to baby.   Nurse as often as the baby requests, or when you feel the need to reduce the fullness of your breasts.   Awaken the baby if it has been 3 4 hours since the last feeding.   Frequent feeding will help the mother make more milk and will help prevent problems, such as sore nipples and engorgement of the breasts.  BABY'S POSITION AT THE BREAST  Whether lying down or sitting, be sure that the baby's tummy is facing your tummy.   Support the breast with 4 fingers underneath the breast and the thumb above. Make sure your fingers are well away from the nipple and baby's mouth.   Stroke the baby's lips gently with your finger or nipple.   When the  baby's mouth is open wide enough, place all of your nipple and as much of the areola as possible into your baby's mouth.   Pull the baby in  close so the tip of the nose and the baby's cheeks touch the breast during the feeding.  FEEDINGS AND SUCTION  The length of each feeding varies from baby to baby and from feeding to feeding.   The baby must suck about 2 3 minutes for your milk to get to him or her. This is called a "let down." For this reason, allow the baby to feed on each breast as long as he or she wants. Your baby will end the feeding when he or she has received the right balance of nutrients.   To break the suction, put your finger into the corner of the baby's mouth and slide it between his or her gums before removing your breast from his or her mouth. This will help prevent sore nipples.  HOW TO TELL WHETHER YOUR BABY IS GETTING ENOUGH BREAST MILK. Wondering whether or not your baby is getting enough milk is a common concern among mothers. You can be assured that your baby is getting enough milk if:   Your baby is actively sucking and you hear swallowing.   Your baby seems relaxed and satisfied after a feeding.   Your baby nurses at least 8 12 times in a 24 hour time period. Nurse your baby until he or she unlatches or falls asleep at the first breast (at least 10 20 minutes), then offer the second side.   Your baby is wetting 5 6 disposable diapers (6 8 cloth diapers) in a 24 hour period by 36 75 days of age.   Your baby is having at least 3 4 stools every 24 hours for the first 6 weeks. The stool should be soft and yellow.   Your baby should gain 4 7 ounces per week after he or she is 16 days old.   Your breasts feel softer after nursing.  REDUCING BREAST ENGORGEMENT  In the first week after your baby is born, you may experience signs of breast engorgement. When breasts are engorged, they feel heavy, warm, full, and may be tender to the touch. You can reduce  engorgement if you:   Nurse frequently, every 2 3 hours. Mothers who breastfeed early and often have fewer problems with engorgement.   Place light ice packs on your breasts for 10 20 minutes between feedings. This reduces swelling. Wrap the ice packs in a lightweight towel to protect your skin. Bags of frozen vegetables work well for this purpose.   Take a warm shower or apply warm, moist heat to your breast for 5 10 minutes just before each feeding. This increases circulation and helps the milk flow.   Gently massage your breast before and during the feeding. Using your finger tips, massage from the chest wall towards your nipple in a circular motion.   Make sure that the baby empties at least one breast at every feeding before switching sides.   Use a breast pump to empty the breasts if your baby is sleepy or not nursing well. You may also want to pump if you are returning to work oryou feel you are getting engorged.   Avoid bottle feeds, pacifiers, or supplemental feedings of water or juice in place of breastfeeding. Breast milk is all the food your baby needs. It is not necessary for your baby to have water or formula. In fact, to help your breasts make more milk, it is best not to give your baby supplemental feedings during the early weeks.   Be sure the baby is latched  on and positioned properly while breastfeeding.   Wear a supportive bra, avoiding underwire styles.   Eat a balanced diet with enough fluids.   Rest often, relax, and take your prenatal vitamins to prevent fatigue, stress, and anemia.  If you follow these suggestions, your engorgement should improve in 24 48 hours. If you are still experiencing difficulty, call your lactation consultant or caregiver.  CARING FOR YOURSELF Take care of your breasts  Bathe or shower daily.   Avoid using soap on your nipples.   Start feedings on your left breast at one feeding and on your right breast at the next  feeding.   You will notice an increase in your milk supply 2 5 days after delivery. You may feel some discomfort from engorgement, which makes your breasts very firm and often tender. Engorgement "peaks" out within 24 48 hours. In the meantime, apply warm moist towels to your breasts for 5 10 minutes before feeding. Gentle massage and expression of some milk before feeding will soften your breasts, making it easier for your baby to latch on.   Wear a well-fitting nursing bra, and air dry your nipples for a 3 after each feeding.   Only use cotton bra pads.   Only use pure lanolin on your nipples after nursing. You do not need to wash it off before feeding the baby again. Another option is to express a few drops of breast milk and gently massage it into your nipples.  Take care of yourself  Eat well-balanced meals and nutritious snacks.   Drinking milk, fruit juice, and water to satisfy your thirst (about 8 glasses a day).   Get plenty of rest.  Avoid foods that you notice affect the baby in a bad way.  SEEK MEDICAL CARE IF:   You have difficulty with breastfeeding and need help.   You have a hard, red, sore area on your breast that is accompanied by a fever.   Your baby is too sleepy to eat well or is having trouble sleeping.   Your baby is wetting less than 6 diapers a day, by 22 days of age.   Your baby's skin or white part of his or her eyes is more yellow than it was in the hospital.   You feel depressed.  Document Released: 02/18/2005 Document Revised: 08/20/2011 Document Reviewed: 05/19/2011 [facility] Patient Information 2013 [place], [place]. Breastfeeding, Twins or Multiples Mothers of twins or multiples might feel overwhelmed with the idea of breastfeeding more than one baby at a time. It is easier and less expensive to breastfeed twins than to bottle feed them. This is because you do not need to buy infant formula, wash bottles, buy mild soap, and fill  the bottles for more than one baby when it is time to feed them. Human milk is especially important for twins, who are often small at birth and need all the advantages breast milk can provide. Breastfeeding also helps create a unique and special bond between the mother and each of her infants.  Mothers of multiples get more benefits from breastfeeding:  Your uterus contracts and returns to its original size faster. This is helpful because it has stretched even more than normal to hold more than one baby.  Hormones are released that relax the mother. This is helpful with the added stress of caring for more than one infant.  The mother often finds she is saving herself time and money, because there is no need to prepare formula or  bottles. Your milk is available whenever your babies are ready to feed, at the right temperature, providing optimal nutrition. If the babies are premature and unable to nurse, you can pump your breasts and freeze the milk until the babies are ready to feed at the breast. To stimulate a milk supply, your breasts need to be emptied at least 8 to 10 times in a 24 hour period. Ask a lactation specialist to help you choose an effective breast pump and to provide guidance in helping your babies latch onto, and feed from, the breast when they are ready. TO GET STARTED: Nurse as soon as possible after birth, and as often as the babies want to do so. This will stimulate your breasts to produce adequate amounts of milk. Mothers of twins almost always produce enough milk for both babies.  TIPS TO INCREASE SUCCESS:  Many mothers of multiples find it easiest to nurse the babies together. However, if one of the infants is having difficulty latching or sucking, the mother may need to give that baby her full attention when it is time to feed.  Nursing two babies at the same time often gets easier as the babies get older and more experienced at latching onto the breast. Extra pillows under the  mother's arms, legs, and under the babies can help this process.  Breastfeeding two babies at once may increase the mother's milk producing hormone (prolactin) levels, and boost her milk production. The more often the babies breastfeed effectively, the more milk the mother will produce.  Switch the babies from one side to the other at alternate feedings. For instance, if baby A feeds from the right breast and baby B feeds from the left breast, then at the next feeding, baby A should take the left breast and baby B the right breast. This ensures that both breasts get equal amounts of stimulation. It also uses the stronger sucking twin to increase the milk supply for the twin whose suck is weaker.  If one of the babies is having difficulty feeding, it may help to try breastfeeding him at the same time as his sibling. The baby with the stronger or more effective suck will stimulate the mother's milk to flow faster. This will encourage his twin to suck and swallow correctly.  It is important to avoid limiting the amount of time each baby spends feeding at the breast. This allows both babies to obtain the fattier milk that is available at the end of the feeding, when the breast is emptier.  Avoiding bottles and pacifiers during the early weeks will encourage effective sucking patterns and help establish a good milk supply. You should not need supplements if you empty your breasts with each feeding.  A good latch for both infants is important in helping the babies empty the breast effectively, and for avoiding sore nipples. The most common cause of soreness is improper latch-on and positioning.  In the early days, keep track of each infant's stools and wet diapers, to make sure each baby is getting enough milk. In the first 6 weeks, each baby should have 6 to 8 wet cloth diapers (5 to 6 disposable diapers) and 2 or more bowel movements per day.  There are several positions and holds that make it easier to  nurse more than one baby at a time. Ask your nurse or lactation specialist to suggest tips on positioning.  If you know you are having twins, talk with a lactation consultant about more ways you  can increase your success at breastfeeding. Document Released: 06/18/2004 Document Revised: 05/13/2011 Document Reviewed: 01/05/2009 [facility] Patient Information 2013 [place], [place].

## 2012-07-13 ENCOUNTER — Other Ambulatory Visit: Payer: Self-pay | Admitting: Family Medicine

## 2012-07-13 DIAGNOSIS — Z3492 Encounter for supervision of normal pregnancy, unspecified, second trimester: Secondary | ICD-10-CM

## 2012-07-13 DIAGNOSIS — O34219 Maternal care for unspecified type scar from previous cesarean delivery: Secondary | ICD-10-CM

## 2012-07-15 ENCOUNTER — Ambulatory Visit (HOSPITAL_COMMUNITY)
Admission: RE | Admit: 2012-07-15 | Discharge: 2012-07-15 | Disposition: A | Discharge status: Home / Self Care | Payer: Medicaid Other | Source: Ambulatory Visit | Attending: Obstetrics and Gynecology | Admitting: Obstetrics and Gynecology

## 2012-07-15 ENCOUNTER — Encounter (HOSPITAL_COMMUNITY): Payer: Self-pay

## 2012-07-15 VITALS — BP 169/80 | HR 97 | Wt 331.8 lb

## 2012-07-15 DIAGNOSIS — O30102 Triplet pregnancy, unspecified number of placenta and unspecified number of amniotic sacs, second trimester: Secondary | ICD-10-CM

## 2012-07-15 DIAGNOSIS — O30109 Triplet pregnancy, unspecified number of placenta and unspecified number of amniotic sacs, unspecified trimester: Secondary | ICD-10-CM | POA: Insufficient documentation

## 2012-07-15 DIAGNOSIS — O09299 Supervision of pregnancy with other poor reproductive or obstetric history, unspecified trimester: Secondary | ICD-10-CM | POA: Insufficient documentation

## 2012-07-15 DIAGNOSIS — Z3492 Encounter for supervision of normal pregnancy, unspecified, second trimester: Secondary | ICD-10-CM

## 2012-07-15 DIAGNOSIS — O0990 Supervision of high risk pregnancy, unspecified, unspecified trimester: Secondary | ICD-10-CM

## 2012-07-15 DIAGNOSIS — O34219 Maternal care for unspecified type scar from previous cesarean delivery: Secondary | ICD-10-CM

## 2012-07-15 DIAGNOSIS — E669 Obesity, unspecified: Secondary | ICD-10-CM | POA: Insufficient documentation

## 2012-07-15 DIAGNOSIS — O99210 Obesity complicating pregnancy, unspecified trimester: Secondary | ICD-10-CM | POA: Insufficient documentation

## 2012-07-23 ENCOUNTER — Ambulatory Visit (INDEPENDENT_AMBULATORY_CARE_PROVIDER_SITE_OTHER): Payer: Medicaid Other | Admitting: Obstetrics and Gynecology

## 2012-07-23 ENCOUNTER — Encounter: Payer: Self-pay | Admitting: Obstetrics and Gynecology

## 2012-07-23 VITALS — BP 144/92 | Temp 96.6°F | Wt 330.5 lb

## 2012-07-23 DIAGNOSIS — O09292 Supervision of pregnancy with other poor reproductive or obstetric history, second trimester: Secondary | ICD-10-CM

## 2012-07-23 DIAGNOSIS — O0990 Supervision of high risk pregnancy, unspecified, unspecified trimester: Secondary | ICD-10-CM

## 2012-07-23 DIAGNOSIS — R87613 High grade squamous intraepithelial lesion on cytologic smear of cervix (HGSIL): Secondary | ICD-10-CM

## 2012-07-23 DIAGNOSIS — O30109 Triplet pregnancy, unspecified number of placenta and unspecified number of amniotic sacs, unspecified trimester: Secondary | ICD-10-CM

## 2012-07-23 DIAGNOSIS — O10019 Pre-existing essential hypertension complicating pregnancy, unspecified trimester: Secondary | ICD-10-CM

## 2012-07-23 DIAGNOSIS — O09299 Supervision of pregnancy with other poor reproductive or obstetric history, unspecified trimester: Secondary | ICD-10-CM

## 2012-07-23 DIAGNOSIS — O30102 Triplet pregnancy, unspecified number of placenta and unspecified number of amniotic sacs, second trimester: Secondary | ICD-10-CM

## 2012-07-23 DIAGNOSIS — O10912 Unspecified pre-existing hypertension complicating pregnancy, second trimester: Secondary | ICD-10-CM

## 2012-07-23 DIAGNOSIS — O34219 Maternal care for unspecified type scar from previous cesarean delivery: Secondary | ICD-10-CM

## 2012-07-23 LAB — POCT URINALYSIS DIP (DEVICE)
Bilirubin Urine: NEGATIVE
Glucose, UA: NEGATIVE mg/dL
Nitrite: NEGATIVE
Specific Gravity, Urine: 1.02 (ref 1.005–1.030)

## 2012-07-23 NOTE — Progress Notes (Signed)
HR 85

## 2012-07-23 NOTE — Progress Notes (Signed)
Patient doing well without complaints. F/U growth ultrasound in June. Patient not certain of wanting a BTL. She is still willing to sign paper in case she wants it at the time of c-section.

## 2012-08-06 ENCOUNTER — Ambulatory Visit (INDEPENDENT_AMBULATORY_CARE_PROVIDER_SITE_OTHER): Payer: Medicaid Other | Admitting: Family Medicine

## 2012-08-06 VITALS — BP 137/79 | Wt 335.7 lb

## 2012-08-06 DIAGNOSIS — O30102 Triplet pregnancy, unspecified number of placenta and unspecified number of amniotic sacs, second trimester: Secondary | ICD-10-CM

## 2012-08-06 DIAGNOSIS — O30109 Triplet pregnancy, unspecified number of placenta and unspecified number of amniotic sacs, unspecified trimester: Secondary | ICD-10-CM

## 2012-08-06 DIAGNOSIS — R82998 Other abnormal findings in urine: Secondary | ICD-10-CM

## 2012-08-06 DIAGNOSIS — R8281 Pyuria: Secondary | ICD-10-CM

## 2012-08-06 NOTE — Patient Instructions (Signed)

## 2012-08-06 NOTE — Progress Notes (Signed)
Pt is "tired and sore". Not sleeping well due to discomfort but declines medications. BP controlled. Pt has exploratory appt with [facility] due to wanting to see same doctor instead of different people each time. Not sure if she will change practices. Will request f/u appt here but pt advised to cancel if she decides to change providers.

## 2012-08-06 NOTE — Progress Notes (Signed)
Pulse: 84 Has not taken labetalol in 2 days.  Patients brother was murdered 2 weeks ago.

## 2012-08-12 ENCOUNTER — Ambulatory Visit (HOSPITAL_COMMUNITY)
Admission: RE | Admit: 2012-08-12 | Discharge: 2012-08-12 | Disposition: A | Discharge status: Home / Self Care | Payer: Medicaid Other | Source: Ambulatory Visit | Attending: Obstetrics & Gynecology | Admitting: Obstetrics & Gynecology

## 2012-08-12 VITALS — BP 136/71 | HR 80 | Wt 340.0 lb

## 2012-08-12 DIAGNOSIS — O30102 Triplet pregnancy, unspecified number of placenta and unspecified number of amniotic sacs, second trimester: Secondary | ICD-10-CM

## 2012-08-12 DIAGNOSIS — O30109 Triplet pregnancy, unspecified number of placenta and unspecified number of amniotic sacs, unspecified trimester: Secondary | ICD-10-CM | POA: Insufficient documentation

## 2012-08-12 DIAGNOSIS — O10019 Pre-existing essential hypertension complicating pregnancy, unspecified trimester: Secondary | ICD-10-CM | POA: Insufficient documentation

## 2012-08-12 DIAGNOSIS — O10912 Unspecified pre-existing hypertension complicating pregnancy, second trimester: Secondary | ICD-10-CM

## 2012-08-12 DIAGNOSIS — O09299 Supervision of pregnancy with other poor reproductive or obstetric history, unspecified trimester: Secondary | ICD-10-CM | POA: Insufficient documentation

## 2012-08-12 DIAGNOSIS — O99210 Obesity complicating pregnancy, unspecified trimester: Secondary | ICD-10-CM | POA: Insufficient documentation

## 2012-08-12 DIAGNOSIS — O34219 Maternal care for unspecified type scar from previous cesarean delivery: Secondary | ICD-10-CM | POA: Insufficient documentation

## 2012-08-12 DIAGNOSIS — E669 Obesity, unspecified: Secondary | ICD-10-CM | POA: Insufficient documentation

## 2012-08-12 NOTE — Progress Notes (Signed)
Lynford Humphrey DARLEENE CUMPIAN  was seen today for an ultrasound appointment.  See full report in [facility].  Impression: Triamniotic/ Trichorionic triplet gestation with best dates of 27 5/7 weeks  Baby A: Maternal right, transverse lie, anterior placenta Interval growth is appropriate (31st %tile) Normal interval anatomy.  Limited views of the aotric and ductal arches obtained. Normal amniotic fluid volume.  Baby B: Breech, posterior placenta Normal interval growth (42nd %tile) Normal interval anatomy.  Limited views of the aortic and ductal arches obtained. Normal amniotic fluid volume  Baby C: Transverse, anterior placenta Interval growth is appropriate (44th %tile) Normal interval anatomy- RVOT not visualized Normal amniotic fluid volume  Recommendations: Recommend follow-up ultrasound examination in 4 weeks.  Alpha Gula, MD

## 2012-08-17 ENCOUNTER — Ambulatory Visit (INDEPENDENT_AMBULATORY_CARE_PROVIDER_SITE_OTHER): Payer: Medicaid Other | Admitting: Obstetrics & Gynecology

## 2012-08-17 VITALS — BP 137/84 | Temp 98.1°F | Wt 339.0 lb

## 2012-08-17 DIAGNOSIS — Z3480 Encounter for supervision of other normal pregnancy, unspecified trimester: Secondary | ICD-10-CM

## 2012-08-17 DIAGNOSIS — Z3482 Encounter for supervision of other normal pregnancy, second trimester: Secondary | ICD-10-CM

## 2012-08-17 LAB — POCT URINALYSIS DIPSTICK
Bilirubin, UA: NEGATIVE
Blood, UA: NEGATIVE
Glucose, UA: NEGATIVE
Ketones, UA: NEGATIVE
Nitrite, UA: NEGATIVE
Spec Grav, UA: 1.01
Urobilinogen, UA: NEGATIVE
pH, UA: 7

## 2012-08-17 NOTE — Patient Instructions (Addendum)
Tetanus, Diphtheria, Pertussis (Tdap) Vaccine What You Need to Know WHY GET VACCINATED? Tetanus, diphtheria and pertussis can be very serious diseases, even for adolescents and adults. Tdap vaccine can protect us from these diseases. TETANUS (Lockjaw) causes painful muscle tightening and stiffness, usually all over the body.  It can lead to tightening of muscles in the head and neck so you can't open your mouth, swallow, or sometimes even breathe. Tetanus kills about 1 out of 5 people who are infected. DIPHTHERIA can cause a thick coating to form in the back of the throat.  It can lead to breathing problems, paralysis, heart failure, and death. PERTUSSIS (Whooping Cough) causes severe coughing spells, which can cause difficulty breathing, vomiting and disturbed sleep.  It can also lead to weight loss, incontinence, and rib fractures. Up to 2 in 100 adolescents and 5 in 100 adults with pertussis are hospitalized or have complications, which could include pneumonia and death. These diseases are caused by bacteria. Diphtheria and pertussis are spread from person to person through coughing or sneezing. Tetanus enters the body through cuts, scratches, or wounds. Before vaccines, the [place] saw as many as 200,000 cases a year of diphtheria and pertussis, and hundreds of cases of tetanus. Since vaccination began, tetanus and diphtheria have dropped by about 99% and pertussis by about 80%. TDAP VACCINE Tdap vaccine can protect adolescents and adults from tetanus, diphtheria, and pertussis. One dose of Tdap is routinely given at age 11 or 12. People who did not get Tdap at that age should get it as soon as possible. Tdap is especially important for health care professionals and anyone having close contact with a baby younger than 12 months. Pregnant women should get a dose of Tdap during every pregnancy, to protect the newborn from pertussis. Infants are most at risk for severe, life-threatening  complications from pertussis. A similar vaccine, called Td, protects from tetanus and diphtheria, but not pertussis. A Td booster should be given every 10 years. Tdap may be given as one of these boosters if you have not already gotten a dose. Tdap may also be given after a severe cut or burn to prevent tetanus infection. Your doctor can give you more information. Tdap may safely be given at the same time as other vaccines. SOME PEOPLE SHOULD NOT GET THIS VACCINE  If you ever had a life-threatening allergic reaction after a dose of any tetanus, diphtheria, or pertussis containing vaccine, OR if you have a severe allergy to any part of this vaccine, you should not get Tdap. Tell your doctor if you have any severe allergies.  If you had a coma, or long or multiple seizures within 7 days after a childhood dose of DTP or DTaP, you should not get Tdap, unless a cause other than the vaccine was found. You can still get Td.  Talk to your doctor if you:  have epilepsy or another nervous system problem,  had severe pain or swelling after any vaccine containing diphtheria, tetanus or pertussis,  ever had Guillain-Barr Syndrome (GBS),  aren't feeling well on the day the shot is scheduled. RISKS OF A VACCINE REACTION With any medicine, including vaccines, there is a chance of side effects. These are usually mild and go away on their own, but serious reactions are also possible. Brief fainting spells can follow a vaccination, leading to injuries from falling. Sitting or lying down for about 15 minutes can help prevent these. Tell your doctor if you feel dizzy or light-headed, or   have vision changes or ringing in the ears. Mild problems following Tdap (Did not interfere with activities)  Pain where the shot was given (about 3 in 4 adolescents or 2 in 3 adults)  Redness or swelling where the shot was given (about 1 person in 5)  Mild fever of at least 100.1F (up to about 1 in 25 adolescents or 1 in  100 adults)  Headache (about 3 or 4 people in 10)  Tiredness (about 1 person in 3 or 4)  Nausea, vomiting, diarrhea, stomach ache (up to 1 in 4 adolescents or 1 in 10 adults)  Chills, body aches, sore joints, rash, swollen glands (uncommon) Moderate problems following Tdap (Interfered with activities, but did not require medical attention)  Pain where the shot was given (about 1 in 5 adolescents or 1 in 100 adults)  Redness or swelling where the shot was given (up to about 1 in 16 adolescents or 1 in 25 adults)  Fever over 102F (about 1 in 100 adolescents or 1 in 250 adults)  Headache (about 3 in 20 adolescents or 1 in 10 adults)  Nausea, vomiting, diarrhea, stomach ache (up to 1 or 3 people in 100)  Swelling of the entire arm where the shot was given (up to about 3 in 100). Severe problems following Tdap (Unable to perform usual activities, required medical attention)  Swelling, severe pain, bleeding and redness in the arm where the shot was given (rare). A severe allergic reaction could occur after any vaccine (estimated less than 1 in a million doses). WHAT IF THERE IS A SERIOUS REACTION? What should I look for?  Look for anything that concerns you, such as signs of a severe allergic reaction, very high fever, or behavior changes. Signs of a severe allergic reaction can include hives, swelling of the face and throat, difficulty breathing, a fast heartbeat, dizziness, and weakness. These would start a few minutes to a few hours after the vaccination. What should I do?  If you think it is a severe allergic reaction or other emergency that can't wait, call 9-1-1 or get the person to the nearest hospital. Otherwise, call your doctor.  Afterward, the reaction should be reported to the "Vaccine Adverse Event Reporting System" (VAERS). Your doctor might file this report, or you can do it yourself through the VAERS web site at www.vaers.LAgents.no, or by calling 1-847-068-4455. VAERS is  only for reporting reactions. They do not give medical advice.  THE NATIONAL VACCINE INJURY COMPENSATION PROGRAM The National Vaccine Injury Compensation Program (VICP) is a federal program that was created to compensate people who may have been injured by certain vaccines. Persons who believe they may have been injured by a vaccine can learn about the program and about filing a claim by calling 1-(336) 338-1605 or visiting the VICP website at SpiritualWord.at. HOW CAN I LEARN MORE?  Ask your doctor.  Call your local or state health department.  Contact the Centers for Disease Control and Prevention (CDC):  Call (228)853-2975 or visit CDC's website at PicCapture.uy. CDC Tdap Vaccine VIS (07/11/11) Document Released: 08/20/2011 Document Revised: 11/13/2011 Document Reviewed: 08/20/2011 [employer] Patient Information 2014 [place], [place]. Glucose Tolerance Test This is a test to see how your body processes carbohydrates. This test is often done to check patients for diabetes or the possibility of developing it. PREPARATION FOR TEST You should have nothing to eat or drink 12 hours before the test. You will be given a form of sugar (glucose) and then blood samples will be  drawn from your vein to determine the level of sugar in your blood. Alternatively, blood may be drawn from your finger for testing. You should not smoke or exercise during the test. NORMAL FINDINGS  Fasting: 70-115 mg/dL  30 minutes: less than 200 mg/dL  1 hour: less than 409 mg/dL  2 hours: less than 811 mg/dL  3 hours: 91-478 mg/dL  4 hours: 29-562 mg/dL Ranges for normal findings may vary among different laboratories and hospitals. You should always check with your doctor after having lab work or other tests done to discuss the meaning of your test results and whether your values are considered within normal limits. MEANING OF TEST Your caregiver will go over the test results with you and discuss  the importance and meaning of your results, as well as treatment options and the need for additional tests. OBTAINING THE TEST RESULTS It is your responsibility to obtain your test results. Ask the lab or department performing the test when and how you will get your results. Document Released: 03/13/2004 Document Revised: 05/13/2011 Document Reviewed: 01/30/2008 [facility] Patient Information 2014 [place], [place]. Etonogestrel implant What is this medicine? ETONOGESTREL is a contraceptive (birth control) device. It is used to prevent pregnancy. It can be used for up to 3 years. This medicine may be used for other purposes; ask your health care provider or pharmacist if you have questions. What should I tell my health care provider before I take this medicine? They need to know if you have any of these conditions: -abnormal vaginal bleeding -blood vessel disease or blood clots -cancer of the breast, cervix, or liver -depression -diabetes -gallbladder disease -headaches -heart disease or recent heart attack -high blood pressure -high cholesterol -kidney disease -liver disease -renal disease -seizures -tobacco smoker -an unusual or allergic reaction to etonogestrel, other hormones, anesthetics or antiseptics, medicines, foods, dyes, or preservatives -pregnant or trying to get pregnant -breast-feeding How should I use this medicine? This device is inserted just under the skin on the inner side of your upper arm by a health care professional. Talk to your pediatrician regarding the use of this medicine in children. Special care may be needed. Overdosage: If you think you've taken too much of this medicine contact a poison control center or emergency room at once. Overdosage: If you think you have taken too much of this medicine contact a poison control center or emergency room at once. NOTE: This medicine is only for you. Do not share this medicine with others. What if I miss a  dose? This does not apply. What may interact with this medicine? Do not take this medicine with any of the following medications: -amprenavir -bosentan -fosamprenavir This medicine may also interact with the following medications: -barbiturate medicines for inducing sleep or treating seizures -certain medicines for fungal infections like ketoconazole and itraconazole -griseofulvin -medicines to treat seizures like carbamazepine, felbamate, oxcarbazepine, phenytoin, topiramate -modafinil -phenylbutazone -rifampin -some medicines to treat HIV infection like atazanavir, indinavir, lopinavir, nelfinavir, tipranavir, ritonavir -St. John's wort This list may not describe all possible interactions. Give your health care provider a list of all the medicines, herbs, non-prescription drugs, or dietary supplements you use. Also tell them if you smoke, drink alcohol, or use illegal drugs. Some items may interact with your medicine. What should I watch for while using this medicine? This product does not protect you against HIV infection (AIDS) or other sexually transmitted diseases. You should be able to feel the implant by pressing your fingertips over the skin where  it was inserted. Tell your doctor if you cannot feel the implant. What side effects may I notice from receiving this medicine? Side effects that you should report to your doctor or health care professional as soon as possible: -allergic reactions like skin rash, itching or hives, swelling of the face, lips, or tongue -breast lumps -changes in vision -confusion, trouble speaking or understanding -dark urine -depressed mood -general ill feeling or flu-like symptoms -light-colored stools -loss of appetite, nausea -right upper belly pain -severe headaches -severe pain, swelling, or tenderness in the abdomen -shortness of breath, chest pain, swelling in a leg -signs of pregnancy -sudden numbness or weakness of the face, arm or  leg -trouble walking, dizziness, loss of balance or coordination -unusual vaginal bleeding, discharge -unusually weak or tired -yellowing of the eyes or skin Side effects that usually do not require medical attention (Report these to your doctor or health care professional if they continue or are bothersome.): -acne -breast pain -changes in weight -cough -fever or chills -headache -irregular menstrual bleeding -itching, burning, and vaginal discharge -pain or difficulty passing urine -sore throat This list may not describe all possible side effects. Call your doctor for medical advice about side effects. You may report side effects to FDA at 1-800-FDA-1088. Where should I keep my medicine? This drug is given in a hospital or clinic and will not be stored at home. NOTE: This sheet is a summary. It may not cover all possible information. If you have questions about this medicine, talk to your doctor, pharmacist, or health care provider.  2013, Elsevier/Gold Standard. (11/11/2008 3:54:17 PM)

## 2012-08-17 NOTE — Progress Notes (Signed)
Pulse-105 Pt states she is not sleeping well. Pt states she sleeps for about 30 minutes and wakes up.   Subjective:    Jenny Mann is being seen today for her first obstetrical visit.  This is not a planned pregnancy. She is at [redacted]w[redacted]d gestation. Her obstetrical history is significant for triplet pregnancy and hypertension. Relationship with FOB: significant other, living together. Patient does intend to breast feed. Pregnancy history fully reviewed.  Menstrual History: OB History   Grav Para Term Preterm Abortions TAB SAB Ect Mult Living   5 3 3  0 1 1 0 0 0 3     Pap: 2014 Abnormal-pt had a colpo Menarche age: 20  Patient's last menstrual period was 01/02/2012.    The following portions of the patient's history were reviewed and updated as appropriate: allergies, current medications, past family history, past medical history, past social history and past surgical history.  Review of Systems Pertinent items are noted in HPI.    Objective:   General Appearance:    Alert, cooperative, no distress, appears stated age  Head:    Normocephalic, without obvious abnormality, atraumatic  Eyes:    PERRL, conjunctiva/corneas clear, EOM's intact, fundi    benign, both eyes  Ears:    Normal TM's and external ear canals, both ears  Nose:   Nares normal, septum midline, mucosa normal, no drainage    or sinus tenderness  Throat:   Lips, mucosa, and tongue normal; teeth and gums normal  Neck:   Supple, symmetrical, trachea midline, no adenopathy;    thyroid:  no enlargement/tenderness/nodules; no carotid   bruit or JVD  Back:     Symmetric, no curvature, ROM normal, no CVA tenderness  Lungs:     Clear to auscultation bilaterally, respirations unlabored  Chest Wall:    No tenderness or deformity   Heart:    Regular rate and rhythm, S1 and S2 normal, no murmur, rub   or gallop  Breast Exam:    No tenderness, masses, or nipple abnormality  Abdomen:     Soft, non-tender, bowel sounds active all  four quadrants,    no masses, no organomegaly  Genitalia:    Normal female without lesion, discharge or tenderness  Extremities:   Extremities normal, atraumatic, no cyanosis or edema  Pulses:   2+ and symmetric all extremities  Skin:   Skin color, texture, turgor normal, no rashes or lesions  Lymph nodes:   Cervical, supraclavicular, and axillary nodes normal  Neurologic:   CNII-XII intact, normal strength, sensation and reflexes    throughout    Assessment:    Pregnancy at [redacted]w[redacted]d weeks    Plan:    Initial labs drawn. Prenatal vitamins. Problem list reviewed and updated.  Follow up in 1 weeks. 50% of 20 min visit spent on counseling and coordination of care.

## 2012-08-20 ENCOUNTER — Encounter: Payer: Medicaid Other | Admitting: Family

## 2012-08-23 ENCOUNTER — Inpatient Hospital Stay (HOSPITAL_COMMUNITY)
Admission: AD | Admit: 2012-08-23 | Discharge: 2012-08-25 | DRG: 781 | Disposition: A | Discharge status: Home / Self Care | Payer: Medicaid Other | Source: Ambulatory Visit | Attending: Obstetrics | Admitting: Obstetrics

## 2012-08-23 DIAGNOSIS — O0990 Supervision of high risk pregnancy, unspecified, unspecified trimester: Secondary | ICD-10-CM

## 2012-08-23 DIAGNOSIS — O4700 False labor before 37 completed weeks of gestation, unspecified trimester: Secondary | ICD-10-CM | POA: Diagnosis present

## 2012-08-23 DIAGNOSIS — R110 Nausea: Secondary | ICD-10-CM

## 2012-08-23 DIAGNOSIS — O30109 Triplet pregnancy, unspecified number of placenta and unspecified number of amniotic sacs, unspecified trimester: Secondary | ICD-10-CM | POA: Diagnosis present

## 2012-08-23 DIAGNOSIS — O219 Vomiting of pregnancy, unspecified: Secondary | ICD-10-CM

## 2012-08-23 DIAGNOSIS — O210 Mild hyperemesis gravidarum: Secondary | ICD-10-CM

## 2012-08-23 DIAGNOSIS — R109 Unspecified abdominal pain: Secondary | ICD-10-CM | POA: Diagnosis present

## 2012-08-23 DIAGNOSIS — O212 Late vomiting of pregnancy: Principal | ICD-10-CM | POA: Diagnosis present

## 2012-08-23 HISTORY — DX: Gestational (pregnancy-induced) hypertension without significant proteinuria, unspecified trimester: O13.9

## 2012-08-24 ENCOUNTER — Encounter (HOSPITAL_COMMUNITY): Payer: Self-pay | Admitting: *Deleted

## 2012-08-24 LAB — CBC
MCH: 25.9 pg — ABNORMAL LOW (ref 26.0–34.0)
MCHC: 33.6 g/dL (ref 30.0–36.0)
MCV: 77 fL — ABNORMAL LOW (ref 78.0–100.0)
Platelets: 162 10*3/uL (ref 150–400)
RDW: 13.1 % (ref 11.5–15.5)
WBC: 9.3 10*3/uL (ref 4.0–10.5)

## 2012-08-24 LAB — URINALYSIS, ROUTINE W REFLEX MICROSCOPIC
Bilirubin Urine: NEGATIVE
Ketones, ur: NEGATIVE mg/dL
Nitrite: NEGATIVE
pH: 5.5 (ref 5.0–8.0)

## 2012-08-24 LAB — WET PREP, GENITAL: Trich, Wet Prep: NONE SEEN

## 2012-08-24 LAB — URINE MICROSCOPIC-ADD ON

## 2012-08-24 MED ORDER — LACTATED RINGERS IV SOLN
INTRAVENOUS | Status: DC
  Administered 2012-08-24 – 2012-08-25 (×3): via INTRAVENOUS

## 2012-08-24 MED ORDER — ACETAMINOPHEN 325 MG PO TABS
650.0000 mg | ORAL_TABLET | ORAL | Status: DC | PRN
  Administered 2012-08-24: 650 mg via ORAL
  Filled 2012-08-24: qty 2

## 2012-08-24 MED ORDER — AZITHROMYCIN 500 MG PO TABS
500.0000 mg | ORAL_TABLET | Freq: Every day | ORAL | Status: DC
  Administered 2012-08-24: 500 mg via ORAL
  Filled 2012-08-24 (×2): qty 1

## 2012-08-24 MED ORDER — PRENATAL MULTIVITAMIN CH
1.0000 | ORAL_TABLET | Freq: Every day | ORAL | Status: DC
  Administered 2012-08-24: 1 via ORAL
  Filled 2012-08-24: qty 1

## 2012-08-24 MED ORDER — ONDANSETRON 8 MG/NS 50 ML IVPB
8.0000 mg | Freq: Four times a day (QID) | INTRAVENOUS | Status: DC
  Administered 2012-08-24 – 2012-08-25 (×5): 8 mg via INTRAVENOUS
  Filled 2012-08-24 (×7): qty 8

## 2012-08-24 MED ORDER — ACETAMINOPHEN 500 MG PO TABS
1000.0000 mg | ORAL_TABLET | Freq: Once | ORAL | Status: AC
  Administered 2012-08-24: 1000 mg via ORAL
  Filled 2012-08-24: qty 2

## 2012-08-24 MED ORDER — ZOLPIDEM TARTRATE 5 MG PO TABS: 5.0000 mg | ORAL_TABLET | Freq: Every evening | ORAL | Status: DC | PRN

## 2012-08-24 MED ORDER — CALCIUM CARBONATE ANTACID 500 MG PO CHEW: 2.0000 | CHEWABLE_TABLET | ORAL | Status: DC | PRN

## 2012-08-24 MED ORDER — SODIUM CHLORIDE 0.9 % IV SOLN
3.0000 g | Freq: Four times a day (QID) | INTRAVENOUS | Status: DC
  Administered 2012-08-24 – 2012-08-25 (×5): 3 g via INTRAVENOUS
  Filled 2012-08-24 (×7): qty 3

## 2012-08-24 MED ORDER — ONDANSETRON 8 MG PO TBDP
8.0000 mg | ORAL_TABLET | Freq: Once | ORAL | Status: AC
  Administered 2012-08-24: 8 mg via ORAL
  Filled 2012-08-24: qty 1

## 2012-08-24 MED ORDER — DOCUSATE SODIUM 100 MG PO CAPS
100.0000 mg | ORAL_CAPSULE | Freq: Every day | ORAL | Status: DC
  Administered 2012-08-24: 100 mg via ORAL
  Filled 2012-08-24: qty 1

## 2012-08-24 MED ORDER — HYDROMORPHONE HCL PF 1 MG/ML IJ SOLN
1.0000 mg | Freq: Once | INTRAMUSCULAR | Status: AC
  Administered 2012-08-24: 1 mg via INTRAVENOUS
  Filled 2012-08-24: qty 1

## 2012-08-24 MED ORDER — LACTATED RINGERS IV BOLUS (SEPSIS)
1000.0000 mL | Freq: Once | INTRAVENOUS | Status: AC
Start: 2012-08-24 — End: 2012-08-24
  Administered 2012-08-24: 1000 mL via INTRAVENOUS

## 2012-08-24 NOTE — H&P (Signed)
Jenny Mann is a 32 y.o. female presenting for N/V. Maternal Medical History:  Reason for admission: Nausea.  31 y o G5 P3.  EDC 11-08-12.  Pregnancy with triplets.  Presents with N/V and cramping.  Fetal activity: Perceived fetal activity is normal.   Last perceived fetal movement was within the past hour.      OB History   Grav Para Term Preterm Abortions TAB SAB Ect Mult Living   5 3 3  0 1 1 0 0 0 3     Past Medical History  Diagnosis Date  . Abnormal Pap smear   . Hypertension    Past Surgical History  Procedure Laterality Date  . Cesarean section     Family History: family history includes Diabetes in her maternal grandmother and paternal grandmother and Kidney disease in her paternal grandmother. Social History:  reports that she has quit smoking. She has never used smokeless tobacco. She reports that she does not drink alcohol or use illicit drugs.   Prenatal Transfer Tool  Maternal Diabetes: No Genetic Screening: Normal Maternal Ultrasounds/Referrals: Referred to MFM. Fetal Ultrasounds or other Referrals:  None Maternal Substance Abuse:  No Significant Maternal Medications:  None Significant Maternal Lab Results:  None Other Comments:  None  Review of Systems  Gastrointestinal: Positive for nausea.    Dilation: Closed Exam by:: H. Hogan CNM Blood pressure 133/73, pulse 86, temperature 97.8 F (36.6 C), temperature source Oral, resp. rate 20, height 5\' 3"  (1.6 m), weight 343 lb (155.584 kg), last menstrual period 01/02/2012, SpO2 100.00%. Maternal Exam:  Abdomen: Patient reports no abdominal tenderness. Cervix: Cervix evaluated by digital exam.     Physical Exam  Nursing note and vitals reviewed. Constitutional: She is oriented to person, place, and time. She appears well-developed and well-nourished.  HENT:  Head: Normocephalic and atraumatic.  Eyes: Conjunctivae are normal. Pupils are equal, round, and reactive to light.  Neck: Normal range of  motion. Neck supple.  Cardiovascular: Normal rate and regular rhythm.   Respiratory: Effort normal.  GI: Soft.  Genitourinary: Vagina normal and uterus normal.  Musculoskeletal: Normal range of motion.  Neurological: She is alert and oriented to person, place, and time.  Skin: Skin is warm and dry.  Psychiatric: She has a normal mood and affect. Her behavior is normal. Judgment and thought content normal.    Prenatal labs: ABO, Rh: O/POS/-- (02/12 0932) Antibody: NEG (02/12 0932) Rubella: 4.04 (02/12 0932) RPR: NON REAC (02/12 0932)  HBsAg: NEGATIVE (02/12 0932)  HIV: NON REACTIVE (02/12 0932)  GBS:     Assessment/Plan: 29 weeks.  Triplets.  N/V.  Stable.  Admitted for IVF hydration and supportive management.   Hilbert Israelson A 08/24/2012, 4:07 AM

## 2012-08-24 NOTE — MAU Note (Signed)
Patient uncomfortable laying on her back for long periods of time. Requests to turn to her side. Fetal heartbeats difficult to trace when patient is in left lateral position.

## 2012-08-24 NOTE — MAU Provider Note (Signed)
History     CSN: [id number]  Arrival date and time: 08/23/12 2347   First Provider Initiated Contact with Patient 08/24/12 0101      Chief Complaint  Patient presents with  . Emesis  . Abdominal Pain   Emesis  Associated symptoms include abdominal pain.  Abdominal Pain Associated symptoms include vomiting.    Jenny Mann is a 32 y.o. [id number] at [redacted]w[redacted]d with triplets. She presents tonight with cramping and nausea. She denies any LOF or VB and confirms fetal movement.  Past Medical History  Diagnosis Date  . Abnormal Pap smear   . Hypertension     Past Surgical History  Procedure Laterality Date  . Cesarean section      Family History  Problem Relation Age of Onset  . Diabetes Maternal Grandmother   . Diabetes Paternal Grandmother   . Kidney disease Paternal Grandmother     History  Substance Use Topics  . Smoking status: Former Games developer  . Smokeless tobacco: Never Used  . Alcohol Use: No    Allergies:  Allergies  Allergen Reactions  . Shellfish Allergy Anaphylaxis    Prescriptions prior to admission  Medication Sig Dispense Refill  . docusate sodium (COLACE) 100 MG capsule Take 1 capsule (100 mg total) by mouth 2 (two) times daily as needed for constipation.  30 capsule  2  . famotidine (PEPCID AC) 10 MG chewable tablet Chew 2 tablets (20 mg total) by mouth 2 (two) times daily.  60 tablet  0  . metoprolol tartrate (LOPRESSOR) 25 MG tablet Take 1 tablet (25 mg total) by mouth 2 (two) times daily.  60 tablet  2  . Prenatal Multivit-Min-Fe-FA (PRENATAL VITAMINS) 0.8 MG tablet Take 1 tablet by mouth daily.  30 tablet  12    Review of Systems  Gastrointestinal: Positive for vomiting and abdominal pain.   Physical Exam   Blood pressure 133/58, pulse 87, temperature 97.8 F (36.6 C), temperature source Oral, resp. rate 20, height 5\' 3"  (1.6 m), weight 155.584 kg (343 lb), last menstrual period 01/02/2012, SpO2 99.00%.  Physical Exam  Nursing note and  vitals reviewed. Constitutional: She is oriented to person, place, and time. She appears well-developed and well-nourished.  Cardiovascular: Normal rate and normal heart sounds.   Respiratory: Effort normal.  GI: Soft. There is no tenderness.  Genitourinary:  External: no lesion Vagina: large amount of yellowish discharge Cervix: pink, smooth, no CMT, closed/thick/high Uterus: AGA    Neurological: She is alert and oriented to person, place, and time.  Skin: Skin is warm.  Psychiatric: She has a normal mood and affect.    MAU Course  Procedures  Results for orders placed during the hospital encounter of 08/23/12 (from the past 24 hour(s))  URINALYSIS, ROUTINE W REFLEX MICROSCOPIC     Status: Abnormal   Collection Time    08/24/12 12:10 AM      Result Value Range   Color, Urine YELLOW  YELLOW   APPearance CLEAR  CLEAR   Specific Gravity, Urine 1.010  1.005 - 1.030   pH 5.5  5.0 - 8.0   Glucose, UA NEGATIVE  NEGATIVE mg/dL   Hgb urine dipstick NEGATIVE  NEGATIVE   Bilirubin Urine NEGATIVE  NEGATIVE   Ketones, ur NEGATIVE  NEGATIVE mg/dL   Protein, ur NEGATIVE  NEGATIVE mg/dL   Urobilinogen, UA 0.2  0.0 - 1.0 mg/dL   Nitrite NEGATIVE  NEGATIVE   Leukocytes, UA MODERATE (*) NEGATIVE  URINE MICROSCOPIC-ADD ON  Status: None   Collection Time    08/24/12 12:10 AM      Result Value Range   Squamous Epithelial / LPF RARE  RARE   WBC, UA 0-2  <3 WBC/hpf   RBC / HPF 0-2  <3 RBC/hpf   Bacteria, UA RARE  RARE      0122: Spoke with Dr. Clearance Coots, orders for admission to Antenatal given Assessment and Plan  Braxton hicks Nausea Preterm pregnancy Triplet gestation Admit to antenatal  Tawnya Crook 08/24/2012, 1:16 AM

## 2012-08-24 NOTE — MAU Note (Signed)
Pt reports cramping x 2 days, since she had breakfast this am she has had vomiting. Unable to void all day. Denies bleeding.

## 2012-08-24 NOTE — MAU Note (Signed)
Zorita Pang CNM notified unable to trace Baby C. Continue to obtain the same heart rate as baby A. Was able to obtain 3 different heartbeats (Baby A and Baby B on EFM and Baby C doppler)

## 2012-08-24 NOTE — Progress Notes (Signed)
Ur chart review completed.  

## 2012-08-25 ENCOUNTER — Encounter: Payer: Self-pay | Admitting: Obstetrics & Gynecology

## 2012-08-25 MED ORDER — ONDANSETRON 8 MG PO TBDP: 8.0000 mg | ORAL_TABLET | Freq: Three times a day (TID) | ORAL | Status: DC | PRN

## 2012-08-25 NOTE — Progress Notes (Signed)
Patient ID: Jenny Mann, female   DOB: Sep 18, 1980, 32 y.o.   MRN: [id number] Hospital Day: 3  S: Preterm labor symptoms: Occasional cramping.  O: Blood pressure 143/74, pulse 103, temperature 97.4 F (36.3 C), temperature source Oral, resp. rate 22, height 5\' 3"  (1.6 m), weight 155.584 kg (343 lb), last menstrual period 01/02/2012, SpO2 100.00%.   QMV:HQIONGEX: 130-150 bpm Toco: None BMW:UXLKGMWN: Closed Exam by:: Zorita Pang CNM  A/P- 32 y.o. admitted with:  N/V.  Stable.  Tolerating diet.  No PTL.  Ready for discharge.  Present on Admission:  **None**  Pregnancy Complications: Triplets  Preterm labor management: no treatment necessary Dating:  [redacted]w[redacted]d PNL Needed:  none FWB:  good PTL:  stable

## 2012-08-25 NOTE — Discharge Summary (Signed)
Physician Discharge Summary  Patient ID: Jenny Mann MRN: [id number] DOB/AGE: [birth year] 32 y.o.  Admit date: 08/23/2012 Discharge date: 08/25/2012  Admission Diagnoses:  29 weeks.  Triplets.  Nausea and vomiting.  Discharge Diagnoses:  Same.  Improved. Active Problems:   * No active hospital problems. *   Discharged Condition: good  Hospital Course: Admitted with N/V and mild cramping at 29 weeks with triplets.  Responded well to IVF's and antiemetic therapy.  Consults: None  Significant Diagnostic Studies: labs: CBC, CMET  Treatments: IV hydration and Zofran  Discharge Exam: Blood pressure 143/74, pulse 103, temperature 97.4 F (36.3 C), temperature source Oral, resp. rate 22, height 5\' 3"  (1.6 m), weight 155.584 kg (343 lb), last menstrual period 01/02/2012, SpO2 100.00%. General appearance: alert and no distress GI: normal findings: soft, non-tender  Disposition: 01-Home or Self Care  Discharge Orders   Future Appointments Provider Department Dept Phone   08/26/2012 11:15 AM Antionette Char, MD [facility]  [facility] [phone]   09/09/2012 9:00 AM [facility] [phone]   Future Orders Complete By Expires     Discharge activity:  As directed     Discharge diet:  As directed     Discharge instructions  As directed     Comments:      Routine    Do not have sex or do anything that might make you have an orgasm  As directed     Notify physician for a general feeling that "something is not right"  As directed     Notify physician for increase or change in vaginal discharge  As directed     Notify physician for intestinal cramps, with or without diarrhea, sometimes described as "gas pain"  As directed     Notify physician for leaking of fluid  As directed     Notify physician for low, dull backache, unrelieved by heat or Tylenol  As directed     Notify physician for menstrual like cramps  As directed     Notify  physician for pelvic pressure  As directed     Notify physician for uterine contractions.  These may be painless and feel like the uterus is tightening or the baby is  "balling up"  As directed     Notify physician for vaginal bleeding  As directed     PRETERM LABOR:  Includes any of the follwing symptoms that occur between 20 - [redacted] weeks gestation.  If these symptoms are not stopped, preterm labor can result in preterm delivery, placing your baby at risk  As directed         Medication List    TAKE these medications       docusate sodium 100 MG capsule  Commonly known as:  COLACE  Take 1 capsule (100 mg total) by mouth 2 (two) times daily as needed for constipation.     famotidine 10 MG chewable tablet  Commonly known as:  PEPCID AC  Chew 2 tablets (20 mg total) by mouth 2 (two) times daily.     metoprolol tartrate 25 MG tablet  Commonly known as:  LOPRESSOR  Take 1 tablet (25 mg total) by mouth 2 (two) times daily.     ondansetron 8 MG disintegrating tablet  Commonly known as:  ZOFRAN ODT  Take 1 tablet (8 mg total) by mouth every 8 (eight) hours as needed for nausea.     PRENATAL VITAMINS 0.8 MG tablet  Take 1 tablet  by mouth daily.           Follow-up Information   Follow up with Antionette Char A, MD. Schedule an appointment as soon as possible for a visit in 1 week.   Contact information:   [address] [place] [address] [phone]       Signed: Brock Bad 08/25/2012, 8:46 AM

## 2012-08-25 NOTE — Progress Notes (Signed)
Pt up to the bathroom with assistance from FOB

## 2012-08-25 NOTE — Progress Notes (Addendum)
MD notified that unable to get continuous tracing of FHR, lots of FM noted by pt and RN.  MD ok with strip that was obtained.

## 2012-08-26 ENCOUNTER — Ambulatory Visit (INDEPENDENT_AMBULATORY_CARE_PROVIDER_SITE_OTHER): Payer: Medicaid Other | Admitting: Obstetrics & Gynecology

## 2012-08-26 VITALS — BP 130/84 | Temp 98.1°F | Wt 351.8 lb

## 2012-08-26 DIAGNOSIS — Z3480 Encounter for supervision of other normal pregnancy, unspecified trimester: Secondary | ICD-10-CM

## 2012-08-26 DIAGNOSIS — Z3483 Encounter for supervision of other normal pregnancy, third trimester: Secondary | ICD-10-CM

## 2012-08-26 LAB — POCT URINALYSIS DIPSTICK: Bilirubin, UA: NEGATIVE

## 2012-08-26 NOTE — Progress Notes (Signed)
Pulse- 105 Patient rescheduled appointment prior to being seen by provider

## 2012-08-28 ENCOUNTER — Inpatient Hospital Stay (HOSPITAL_COMMUNITY)
Admission: AD | Admit: 2012-08-28 | Discharge: 2012-08-28 | Disposition: A | Discharge status: Home / Self Care | Payer: Medicaid Other | Source: Ambulatory Visit | Attending: Obstetrics & Gynecology | Admitting: Obstetrics & Gynecology

## 2012-08-28 DIAGNOSIS — IMO0002 Reserved for concepts with insufficient information to code with codable children: Secondary | ICD-10-CM

## 2012-08-28 DIAGNOSIS — O1203 Gestational edema, third trimester: Secondary | ICD-10-CM

## 2012-08-28 DIAGNOSIS — O30109 Triplet pregnancy, unspecified number of placenta and unspecified number of amniotic sacs, unspecified trimester: Secondary | ICD-10-CM | POA: Insufficient documentation

## 2012-08-28 DIAGNOSIS — O10019 Pre-existing essential hypertension complicating pregnancy, unspecified trimester: Secondary | ICD-10-CM | POA: Insufficient documentation

## 2012-08-28 LAB — URINALYSIS, ROUTINE W REFLEX MICROSCOPIC
Nitrite: NEGATIVE
Specific Gravity, Urine: <1.005 — ABNORMAL LOW (ref 1.005–1.030)
pH: 6 (ref 5.0–8.0)

## 2012-08-28 LAB — URINE MICROSCOPIC-ADD ON

## 2012-08-28 MED ORDER — FAMOTIDINE 20 MG PO TABS
20.0000 mg | ORAL_TABLET | Freq: Once | ORAL | Status: AC
Start: 2012-08-28 — End: 2012-08-28
  Administered 2012-08-28: 20 mg via ORAL
  Filled 2012-08-28: qty 1

## 2012-08-28 NOTE — MAU Provider Note (Signed)
History     CSN: [id number]  Arrival date and time: 08/28/12 2051   None     Chief Complaint  Patient presents with  . Abdominal Pain  . Leg Swelling  . Fatigue  . Gastrophageal Reflux   HPI 32 y.o. [id number] at [redacted]w[redacted]d with triplet pregnancy, chronic HTN presents with lower extremity swelling and tingling (L>R), worse over past few days, abdominal wall edema that is now tender x 1 day, and vaginal/pelvic pressure that is worse at night, esp tonight. Babies all moving well, no contractions, bleeding, or loss of fluid. No headache, vision changes, or RUQ pain. Does have heartburn/epigastric pain.  Receives prenatal care at [facility], though previously seen at [facility]. Also followed by MFM, last ultrasound 6/11 shows all 3 fetuses with appropriate growthm cervix 3.4 cm.   OB History   Grav Para Term Preterm Abortions TAB SAB Ect Mult Living   5 3 3  0 1 1 0 0 0 3      Past Medical History  Diagnosis Date  . Abnormal Pap smear   . Hypertension   . PIH (pregnancy induced hypertension)     ?2nd and 3rd pregnancy    Past Surgical History  Procedure Laterality Date  . Cesarean section      Family History  Problem Relation Age of Onset  . Diabetes Maternal Grandmother   . Diabetes Paternal Grandmother   . Kidney disease Paternal Grandmother     History  Substance Use Topics  . Smoking status: Former Games developer  . Smokeless tobacco: Never Used  . Alcohol Use: No    Allergies:  Allergies  Allergen Reactions  . Shellfish Allergy Anaphylaxis    Prescriptions prior to admission  Medication Sig Dispense Refill  . docusate sodium (COLACE) 100 MG capsule Take 1 capsule (100 mg total) by mouth 2 (two) times daily as needed for constipation.  30 capsule  2  . famotidine (PEPCID AC) 10 MG chewable tablet Chew 20 mg by mouth 2 (two) times daily as needed for heartburn.      . ondansetron (ZOFRAN ODT) 8 MG disintegrating tablet Take 1 tablet (8 mg total) by mouth every 8  (eight) hours as needed for nausea.  30 tablet  2  . Prenatal Multivit-Min-Fe-FA (PRENATAL VITAMINS) 0.8 MG tablet Take 1 tablet by mouth daily.  30 tablet  12  . [DISCONTINUED] famotidine (PEPCID AC) 10 MG chewable tablet Chew 2 tablets (20 mg total) by mouth 2 (two) times daily.  60 tablet  0    ROS  See HPI  Physical Exam   Height 5\' 3"  (1.6 m), weight 159.439 kg (351 lb 8 oz), last menstrual period 01/02/2012.  Physical Exam GEN:  WNWD, no distress HEENT:  NCAT, EOMI, conjunctiva clear NECK:  Supple, non-tender, no thyromegaly, trachea midline CV: RRR, no murmur RESP:  CTAB ABD:  Soft, non-tender, no guarding or rebound, normal bowel sounds.  Peau d'orange edema with pitting on abdominal wall, no redness or warmth. EXTREM:  Warm, well perfused, 2+ pitting edema bilat lower extremity -foot, ankle, lower calf. Calf circumferences equal, no redness, cords, tenderness, warmth. NEURO:  Alert, oriented, no focal deficits GU:  Cervix closed, long, high  FHTs:  Fetus A:   140, mod var, accels present, no decels Fetus B:   140, mod var, accels present, no decels Fetus C:  135-140, not able to keep on monitor for long   MAU Course  Procedures  Results for orders placed during the hospital  encounter of 08/28/12 (from the past 24 hour(s))  URINALYSIS, ROUTINE W REFLEX MICROSCOPIC     Status: Abnormal   Collection Time    08/28/12  9:00 PM      Result Value Range   Color, Urine YELLOW  YELLOW   APPearance CLEAR  CLEAR   Specific Gravity, Urine <1.005 (*) 1.005 - 1.030   pH 6.0  5.0 - 8.0   Glucose, UA NEGATIVE  NEGATIVE mg/dL   Hgb urine dipstick NEGATIVE  NEGATIVE   Bilirubin Urine NEGATIVE  NEGATIVE   Ketones, ur NEGATIVE  NEGATIVE mg/dL   Protein, ur NEGATIVE  NEGATIVE mg/dL   Urobilinogen, UA 0.2  0.0 - 1.0 mg/dL   Nitrite NEGATIVE  NEGATIVE   Leukocytes, UA SMALL (*) NEGATIVE  URINE MICROSCOPIC-ADD ON     Status: Abnormal   Collection Time    08/28/12  9:00 PM       Result Value Range   Squamous Epithelial / LPF RARE  RARE   WBC, UA 3-6  <3 WBC/hpf   RBC / HPF 0-2  <3 RBC/hpf   Bacteria, UA FEW (*) RARE      Assessment and Plan  31 y.o. [id number] at [redacted]w[redacted]d with triplet pregnancy, edema, pelvic pressure - Cervix closed and long - BP within normal limits - Possible UTI - will send urine for culture - Discussed with Dr. Tamela Oddi - stable for discharge home.  Napoleon Form 08/28/2012, 9:44 PM

## 2012-08-28 NOTE — MAU Note (Signed)
Patient is in with c/o lower extremity swelling, fatigue, lower abdominal pressure and acid reflux. She denies headache, visual disturbance, contractions, vaginal bleeding or lof. She reports good fetal movement.

## 2012-08-31 LAB — URINE CULTURE

## 2012-09-02 ENCOUNTER — Encounter: Payer: Self-pay | Admitting: Obstetrics & Gynecology

## 2012-09-02 ENCOUNTER — Ambulatory Visit (INDEPENDENT_AMBULATORY_CARE_PROVIDER_SITE_OTHER): Payer: Medicaid Other | Admitting: Obstetrics & Gynecology

## 2012-09-02 VITALS — BP 136/88 | Temp 98.1°F | Wt 346.0 lb

## 2012-09-02 DIAGNOSIS — Z3480 Encounter for supervision of other normal pregnancy, unspecified trimester: Secondary | ICD-10-CM

## 2012-09-02 LAB — POCT URINALYSIS DIPSTICK
Blood, UA: NEGATIVE
Glucose, UA: NEGATIVE
Nitrite, UA: NEGATIVE
Urobilinogen, UA: NEGATIVE

## 2012-09-02 NOTE — Progress Notes (Signed)
Doing well 

## 2012-09-02 NOTE — Progress Notes (Signed)
P 91 Patient is feeling increased pressure- but no contractions. Patient was at the hospital last week for swelling- monitored. Patient is not taking her BP medication at this time.

## 2012-09-02 NOTE — Patient Instructions (Signed)
Levonorgestrel intrauterine device (IUD) What is this medicine? LEVONORGESTREL IUD (LEE voe nor jes trel) is a contraceptive (birth control) device. The device is placed inside the uterus by a healthcare professional. It is used to prevent pregnancy and can also be used to treat heavy bleeding that occurs during your period. Depending on the device, it can be used for 3 to 5 years. This medicine may be used for other purposes; ask your health care provider or pharmacist if you have questions. What should I tell my health care provider before I take this medicine? They need to know if you have any of these conditions: -abnormal Pap smear -cancer of the breast, uterus, or cervix -diabetes -endometritis -genital or pelvic infection now or in the past -have more than one sexual partner or your partner has more than one partner -heart disease -history of an ectopic or tubal pregnancy -immune system problems -IUD in place -liver disease or tumor -problems with blood clots or take blood-thinners -use intravenous drugs -uterus of unusual shape -vaginal bleeding that has not been explained -an unusual or allergic reaction to levonorgestrel, other hormones, silicone, or polyethylene, medicines, foods, dyes, or preservatives -pregnant or trying to get pregnant -breast-feeding How should I use this medicine? This device is placed inside the uterus by a health care professional. Talk to your pediatrician regarding the use of this medicine in children. Special care may be needed. Overdosage: If you think you have taken too much of this medicine contact a poison control center or emergency room at once. NOTE: This medicine is only for you. Do not share this medicine with others. What if I miss a dose? This does not apply. What may interact with this medicine? Do not take this medicine with any of the following medications: -amprenavir -bosentan -fosamprenavir This medicine may also interact with  the following medications: -aprepitant -barbiturate medicines for inducing sleep or treating seizures -bexarotene -griseofulvin -medicines to treat seizures like carbamazepine, ethotoin, felbamate, oxcarbazepine, phenytoin, topiramate -modafinil -pioglitazone -rifabutin -rifampin -rifapentine -some medicines to treat HIV infection like atazanavir, indinavir, lopinavir, nelfinavir, tipranavir, ritonavir -St. John's wort -warfarin This list may not describe all possible interactions. Give your health care provider a list of all the medicines, herbs, non-prescription drugs, or dietary supplements you use. Also tell them if you smoke, drink alcohol, or use illegal drugs. Some items may interact with your medicine. What should I watch for while using this medicine? Visit your doctor or health care professional for regular check ups. See your doctor if you or your partner has sexual contact with others, becomes HIV positive, or gets a sexual transmitted disease. This product does not protect you against HIV infection (AIDS) or other sexually transmitted diseases. You can check the placement of the IUD yourself by reaching up to the top of your vagina with clean fingers to feel the threads. Do not pull on the threads. It is a good habit to check placement after each menstrual period. Call your doctor right away if you feel more of the IUD than just the threads or if you cannot feel the threads at all. The IUD may come out by itself. You may become pregnant if the device comes out. If you notice that the IUD has come out use a backup birth control method like condoms and call your health care provider. Using tampons will not change the position of the IUD and are okay to use during your period. What side effects may I notice from receiving this medicine?   Side effects that you should report to your doctor or health care professional as soon as possible: -allergic reactions like skin rash, itching or  hives, swelling of the face, lips, or tongue -fever, flu-like symptoms -genital sores -high blood pressure -no menstrual period for 6 weeks during use -pain, swelling, warmth in the leg -pelvic pain or tenderness -severe or sudden headache -signs of pregnancy -stomach cramping -sudden shortness of breath -trouble with balance, talking, or walking -unusual vaginal bleeding, discharge -yellowing of the eyes or skin Side effects that usually do not require medical attention (report to your doctor or health care professional if they continue or are bothersome): -acne -breast pain -change in sex drive or performance -changes in weight -cramping, dizziness, or faintness while the device is being inserted -headache -irregular menstrual bleeding within first 3 to 6 months of use -nausea This list may not describe all possible side effects. Call your doctor for medical advice about side effects. You may report side effects to FDA at 1-800-FDA-1088. Where should I keep my medicine? This does not apply. NOTE: This sheet is a summary. It may not cover all possible information. If you have questions about this medicine, talk to your doctor, pharmacist, or health care provider.  2013, Elsevier/Gold Standard. (03/21/2011 1:54:04 PM) Etonogestrel implant What is this medicine? ETONOGESTREL is a contraceptive (birth control) device. It is used to prevent pregnancy. It can be used for up to 3 years. This medicine may be used for other purposes; ask your health care provider or pharmacist if you have questions. What should I tell my health care provider before I take this medicine? They need to know if you have any of these conditions: -abnormal vaginal bleeding -blood vessel disease or blood clots -cancer of the breast, cervix, or liver -depression -diabetes -gallbladder disease -headaches -heart disease or recent heart attack -high blood pressure -high cholesterol -kidney disease -liver  disease -renal disease -seizures -tobacco smoker -an unusual or allergic reaction to etonogestrel, other hormones, anesthetics or antiseptics, medicines, foods, dyes, or preservatives -pregnant or trying to get pregnant -breast-feeding How should I use this medicine? This device is inserted just under the skin on the inner side of your upper arm by a health care professional. Talk to your pediatrician regarding the use of this medicine in children. Special care may be needed. Overdosage: If you think you've taken too much of this medicine contact a poison control center or emergency room at once. Overdosage: If you think you have taken too much of this medicine contact a poison control center or emergency room at once. NOTE: This medicine is only for you. Do not share this medicine with others. What if I miss a dose? This does not apply. What may interact with this medicine? Do not take this medicine with any of the following medications: -amprenavir -bosentan -fosamprenavir This medicine may also interact with the following medications: -barbiturate medicines for inducing sleep or treating seizures -certain medicines for fungal infections like ketoconazole and itraconazole -griseofulvin -medicines to treat seizures like carbamazepine, felbamate, oxcarbazepine, phenytoin, topiramate -modafinil -phenylbutazone -rifampin -some medicines to treat HIV infection like atazanavir, indinavir, lopinavir, nelfinavir, tipranavir, ritonavir -St. John's wort This list may not describe all possible interactions. Give your health care provider a list of all the medicines, herbs, non-prescription drugs, or dietary supplements you use. Also tell them if you smoke, drink alcohol, or use illegal drugs. Some items may interact with your medicine. What should I watch for while using this medicine? This product   does not protect you against HIV infection (AIDS) or other sexually transmitted diseases. You  should be able to feel the implant by pressing your fingertips over the skin where it was inserted. Tell your doctor if you cannot feel the implant. What side effects may I notice from receiving this medicine? Side effects that you should report to your doctor or health care professional as soon as possible: -allergic reactions like skin rash, itching or hives, swelling of the face, lips, or tongue -breast lumps -changes in vision -confusion, trouble speaking or understanding -dark urine -depressed mood -general ill feeling or flu-like symptoms -light-colored stools -loss of appetite, nausea -right upper belly pain -severe headaches -severe pain, swelling, or tenderness in the abdomen -shortness of breath, chest pain, swelling in a leg -signs of pregnancy -sudden numbness or weakness of the face, arm or leg -trouble walking, dizziness, loss of balance or coordination -unusual vaginal bleeding, discharge -unusually weak or tired -yellowing of the eyes or skin Side effects that usually do not require medical attention (Report these to your doctor or health care professional if they continue or are bothersome.): -acne -breast pain -changes in weight -cough -fever or chills -headache -irregular menstrual bleeding -itching, burning, and vaginal discharge -pain or difficulty passing urine -sore throat This list may not describe all possible side effects. Call your doctor for medical advice about side effects. You may report side effects to FDA at 1-800-FDA-1088. Where should I keep my medicine? This drug is given in a hospital or clinic and will not be stored at home. NOTE: This sheet is a summary. It may not cover all possible information. If you have questions about this medicine, talk to your doctor, pharmacist, or health care provider.  2013, Elsevier/Gold Standard. (11/11/2008 3:54:17 PM)  

## 2012-09-03 ENCOUNTER — Other Ambulatory Visit: Payer: Medicaid Other | Admitting: *Deleted

## 2012-09-03 DIAGNOSIS — Z3483 Encounter for supervision of other normal pregnancy, third trimester: Secondary | ICD-10-CM

## 2012-09-03 DIAGNOSIS — Z113 Encounter for screening for infections with a predominantly sexual mode of transmission: Secondary | ICD-10-CM

## 2012-09-03 LAB — CBC
Hemoglobin: 9.9 g/dL — ABNORMAL LOW (ref 12.0–15.0)
MCH: 24.9 pg — ABNORMAL LOW (ref 26.0–34.0)
MCHC: 32.9 g/dL (ref 30.0–36.0)
MCV: 75.6 fL — ABNORMAL LOW (ref 78.0–100.0)

## 2012-09-04 LAB — HIV ANTIBODY (ROUTINE TESTING W REFLEX): HIV: NONREACTIVE

## 2012-09-04 LAB — GLUCOSE TOLERANCE, 2 HOURS W/ 1HR: Glucose, 1 hour: 107 mg/dL (ref 70–170)

## 2012-09-05 ENCOUNTER — Inpatient Hospital Stay (HOSPITAL_COMMUNITY)
Admission: AD | Admit: 2012-09-05 | Discharge: 2012-09-05 | Disposition: A | Discharge status: Home / Self Care | Payer: Medicaid Other | Source: Ambulatory Visit | Attending: Obstetrics & Gynecology | Admitting: Obstetrics & Gynecology

## 2012-09-05 ENCOUNTER — Encounter (HOSPITAL_COMMUNITY): Payer: Self-pay | Admitting: *Deleted

## 2012-09-05 DIAGNOSIS — N949 Unspecified condition associated with female genital organs and menstrual cycle: Secondary | ICD-10-CM | POA: Insufficient documentation

## 2012-09-05 DIAGNOSIS — R109 Unspecified abdominal pain: Secondary | ICD-10-CM | POA: Insufficient documentation

## 2012-09-05 DIAGNOSIS — O99891 Other specified diseases and conditions complicating pregnancy: Secondary | ICD-10-CM | POA: Insufficient documentation

## 2012-09-05 DIAGNOSIS — O30109 Triplet pregnancy, unspecified number of placenta and unspecified number of amniotic sacs, unspecified trimester: Secondary | ICD-10-CM | POA: Insufficient documentation

## 2012-09-05 MED ORDER — TERBUTALINE SULFATE 1 MG/ML IJ SOLN
0.2500 mg | Freq: Once | INTRAMUSCULAR | Status: AC
  Administered 2012-09-05: 0.25 mg via SUBCUTANEOUS
  Filled 2012-09-05: qty 1

## 2012-09-05 NOTE — MAU Note (Signed)
Pt presents with complaints of contractions that started about 2 days ago but have gotten worse throughout the day. Denies any bleeding or leakage of fluid. States babies are active.

## 2012-09-05 NOTE — MAU Provider Note (Signed)
  History     CSN: [id number]  Arrival date and time: 09/05/12 1433   None     Chief Complaint  Patient presents with  . Labor Eval   HPI Jenny Mann is 32 y.o. [phone] [redacted]w[redacted]d weeks presenting with vaginal, abdominal and back pressure since last night.  Didn't come in last night  because she couldn't leave her 2 children alone.  She is carrying TRI/TRI triplets.  She is a patient of Dr. Tamela Oddi. She states her cervix was check "a while ago" and she was closed and that on her last visit 2 weeks ago she was not checked.   She denies vaginal bleeding, leaking of fluid.  She states "all three are moving".  She appears comfortable at this time.    Past Medical History  Diagnosis Date  . Abnormal Pap smear   . Hypertension   . PIH (pregnancy induced hypertension)     ?2nd and 3rd pregnancy    Past Surgical History  Procedure Laterality Date  . Cesarean section      Family History  Problem Relation Age of Onset  . Diabetes Maternal Grandmother   . Diabetes Paternal Grandmother   . Kidney disease Paternal Grandmother     History  Substance Use Topics  . Smoking status: Former Games developer  . Smokeless tobacco: Never Used  . Alcohol Use: No    Allergies:  Allergies  Allergen Reactions  . Shellfish Allergy Anaphylaxis    Prescriptions prior to admission  Medication Sig Dispense Refill  . docusate sodium (COLACE) 100 MG capsule Take 1 capsule (100 mg total) by mouth 2 (two) times daily as needed for constipation.  30 capsule  2  . famotidine (PEPCID AC) 10 MG chewable tablet Chew 20 mg by mouth 2 (two) times daily as needed for heartburn.      . ondansetron (ZOFRAN ODT) 8 MG disintegrating tablet Take 1 tablet (8 mg total) by mouth every 8 (eight) hours as needed for nausea.  30 tablet  2  . Prenatal Multivit-Min-Fe-FA (PRENATAL VITAMINS) 0.8 MG tablet Take 1 tablet by mouth daily.  30 tablet  12    ROS Physical Exam   Last menstrual period 01/02/2012.  Physical  Exam  MAU Course  Procedures  MDM  MSE completed.  Dr. Gaynell Face contacted by Dinah Beers, RN.  Plan of care per Dr. Cleotis Lema M 09/05/2012, 3:04 PM

## 2012-09-05 NOTE — Progress Notes (Signed)
Dr Gaynell Face called notified of pt's presentation and complaints of contractions. Notified him of FHR x3, with UI.orders received.

## 2012-09-09 ENCOUNTER — Encounter: Payer: Medicaid Other | Admitting: Obstetrics & Gynecology

## 2012-09-09 ENCOUNTER — Encounter: Payer: Self-pay | Admitting: Obstetrics & Gynecology

## 2012-09-09 ENCOUNTER — Ambulatory Visit (HOSPITAL_COMMUNITY)
Admission: RE | Admit: 2012-09-09 | Discharge: 2012-09-09 | Disposition: A | Discharge status: Home / Self Care | Payer: Medicaid Other | Source: Ambulatory Visit | Attending: Obstetrics & Gynecology | Admitting: Obstetrics & Gynecology

## 2012-09-09 ENCOUNTER — Encounter (HOSPITAL_COMMUNITY): Payer: Self-pay

## 2012-09-09 VITALS — BP 146/97 | HR 97 | Wt 348.0 lb

## 2012-09-09 DIAGNOSIS — O30109 Triplet pregnancy, unspecified number of placenta and unspecified number of amniotic sacs, unspecified trimester: Secondary | ICD-10-CM | POA: Insufficient documentation

## 2012-09-09 DIAGNOSIS — O09299 Supervision of pregnancy with other poor reproductive or obstetric history, unspecified trimester: Secondary | ICD-10-CM | POA: Insufficient documentation

## 2012-09-09 DIAGNOSIS — O10019 Pre-existing essential hypertension complicating pregnancy, unspecified trimester: Secondary | ICD-10-CM | POA: Insufficient documentation

## 2012-09-09 DIAGNOSIS — O10912 Unspecified pre-existing hypertension complicating pregnancy, second trimester: Secondary | ICD-10-CM

## 2012-09-09 DIAGNOSIS — O30102 Triplet pregnancy, unspecified number of placenta and unspecified number of amniotic sacs, second trimester: Secondary | ICD-10-CM

## 2012-09-09 DIAGNOSIS — O10911 Unspecified pre-existing hypertension complicating pregnancy, first trimester: Secondary | ICD-10-CM

## 2012-09-09 DIAGNOSIS — O34219 Maternal care for unspecified type scar from previous cesarean delivery: Secondary | ICD-10-CM | POA: Insufficient documentation

## 2012-09-09 DIAGNOSIS — E669 Obesity, unspecified: Secondary | ICD-10-CM | POA: Insufficient documentation

## 2012-09-09 NOTE — Progress Notes (Signed)
[facility]  Indication: 32 yr old G44P3013 at [redacted]w[redacted]d with trichorionic/triamniotic triplet gestation, chronic hypertension, and morbid obesity for fetal growth.  Findings: 1. Trichorionic/triammniotic triplet gestation; the dividing membranes are seen. 2. Estimated fetal weight for triplet A is in the 35th%; the abdominal circumference is in the 6th%; triplet B is in the 31st%; triplet C is in the 32nd%. 3. Triplet A placenta is anterior; triplet B placenta is posterior; triplet C placenta is anterior; there is no evidence of previa. 4. Normal amniotic fluid volume for all fetuses. 5. The limited anatomy survey for all fetuses is normal. 6. Anatomy surveys for all 3 fetuses remains limited.  Recommendations: 1. Appropriate fetal growth: - triplet A with lagging abdominal circumference - recommend recheck growth in 2 weeks 2. Triplet pregnancy: - previously counseled - recommend preterm labor precautions - has planned repeat C section at 36 weeks 3. Chronic hypertension: - previously counseled - well controlled off of medication - recommend start antenatal testing at 32 weeks - recommend close surveillance for the development of signs/symptoms of preeclampsia 4. Morbid obesity: - patient understands anatomy surveys remain limited - fetal growth as above  Eulis Foster, MD

## 2012-09-10 ENCOUNTER — Other Ambulatory Visit: Payer: Self-pay | Admitting: Obstetrics & Gynecology

## 2012-09-10 ENCOUNTER — Ambulatory Visit (INDEPENDENT_AMBULATORY_CARE_PROVIDER_SITE_OTHER): Payer: Medicaid Other | Admitting: Obstetrics & Gynecology

## 2012-09-10 VITALS — BP 138/90 | Temp 98.3°F | Wt 346.0 lb

## 2012-09-10 DIAGNOSIS — O30109 Triplet pregnancy, unspecified number of placenta and unspecified number of amniotic sacs, unspecified trimester: Secondary | ICD-10-CM

## 2012-09-10 DIAGNOSIS — Z3480 Encounter for supervision of other normal pregnancy, unspecified trimester: Secondary | ICD-10-CM

## 2012-09-10 DIAGNOSIS — O30102 Triplet pregnancy, unspecified number of placenta and unspecified number of amniotic sacs, second trimester: Secondary | ICD-10-CM

## 2012-09-10 NOTE — Progress Notes (Signed)
P 93 Patient reports she feels a lot of pressure.

## 2012-09-10 NOTE — Progress Notes (Signed)
Doing well 

## 2012-09-11 ENCOUNTER — Encounter: Payer: Self-pay | Admitting: Obstetrics & Gynecology

## 2012-09-11 DIAGNOSIS — O9989 Other specified diseases and conditions complicating pregnancy, childbirth and the puerperium: Secondary | ICD-10-CM

## 2012-09-11 DIAGNOSIS — D509 Iron deficiency anemia, unspecified: Secondary | ICD-10-CM | POA: Insufficient documentation

## 2012-09-11 DIAGNOSIS — R8271 Bacteriuria: Secondary | ICD-10-CM | POA: Insufficient documentation

## 2012-09-15 DIAGNOSIS — D649 Anemia, unspecified: Secondary | ICD-10-CM

## 2012-09-15 HISTORY — DX: Anemia, unspecified: D64.9

## 2012-09-16 ENCOUNTER — Ambulatory Visit (HOSPITAL_COMMUNITY)
Admission: RE | Admit: 2012-09-16 | Discharge: 2012-09-16 | Disposition: A | Discharge status: Home / Self Care | Payer: Medicaid Other | Source: Ambulatory Visit | Attending: Obstetrics | Admitting: Obstetrics

## 2012-09-16 ENCOUNTER — Inpatient Hospital Stay (HOSPITAL_COMMUNITY)
Admission: AD | Admit: 2012-09-16 | Discharge: 2012-09-16 | Disposition: A | Discharge status: Home / Self Care | Payer: Medicaid Other | Source: Ambulatory Visit | Attending: Obstetrics & Gynecology | Admitting: Obstetrics & Gynecology

## 2012-09-16 ENCOUNTER — Encounter (HOSPITAL_COMMUNITY): Payer: Self-pay | Admitting: *Deleted

## 2012-09-16 ENCOUNTER — Encounter: Payer: Self-pay | Admitting: Obstetrics & Gynecology

## 2012-09-16 VITALS — BP 147/92 | HR 113 | Wt 351.0 lb

## 2012-09-16 DIAGNOSIS — O139 Gestational [pregnancy-induced] hypertension without significant proteinuria, unspecified trimester: Secondary | ICD-10-CM | POA: Insufficient documentation

## 2012-09-16 DIAGNOSIS — O09299 Supervision of pregnancy with other poor reproductive or obstetric history, unspecified trimester: Secondary | ICD-10-CM | POA: Insufficient documentation

## 2012-09-16 DIAGNOSIS — E669 Obesity, unspecified: Secondary | ICD-10-CM | POA: Insufficient documentation

## 2012-09-16 DIAGNOSIS — O34219 Maternal care for unspecified type scar from previous cesarean delivery: Secondary | ICD-10-CM | POA: Insufficient documentation

## 2012-09-16 DIAGNOSIS — O365990 Maternal care for other known or suspected poor fetal growth, unspecified trimester, not applicable or unspecified: Secondary | ICD-10-CM | POA: Insufficient documentation

## 2012-09-16 DIAGNOSIS — O10019 Pre-existing essential hypertension complicating pregnancy, unspecified trimester: Secondary | ICD-10-CM | POA: Insufficient documentation

## 2012-09-16 DIAGNOSIS — O30109 Triplet pregnancy, unspecified number of placenta and unspecified number of amniotic sacs, unspecified trimester: Secondary | ICD-10-CM

## 2012-09-16 DIAGNOSIS — O163 Unspecified maternal hypertension, third trimester: Secondary | ICD-10-CM

## 2012-09-16 HISTORY — DX: Anemia, unspecified: D64.9

## 2012-09-16 LAB — URINALYSIS, ROUTINE W REFLEX MICROSCOPIC
Glucose, UA: NEGATIVE mg/dL
Ketones, ur: NEGATIVE mg/dL
Protein, ur: NEGATIVE mg/dL
Urobilinogen, UA: 0.2 mg/dL (ref 0.0–1.0)

## 2012-09-16 LAB — COMPREHENSIVE METABOLIC PANEL
AST: 27 U/L (ref 0–37)
Albumin: 2.3 g/dL — ABNORMAL LOW (ref 3.5–5.2)
BUN: <3 mg/dL — ABNORMAL LOW (ref 6–23)
Calcium: 8.8 mg/dL (ref 8.4–10.5)
Creatinine, Ser: 0.65 mg/dL (ref 0.50–1.10)
Total Protein: 5.7 g/dL — ABNORMAL LOW (ref 6.0–8.3)

## 2012-09-16 LAB — CBC
HCT: 29.1 % — ABNORMAL LOW (ref 36.0–46.0)
MCHC: 33.3 g/dL (ref 30.0–36.0)
MCV: 73.5 fL — ABNORMAL LOW (ref 78.0–100.0)
Platelets: 151 10*3/uL (ref 150–400)
RDW: 13.5 % (ref 11.5–15.5)
WBC: 9.5 10*3/uL (ref 4.0–10.5)

## 2012-09-16 LAB — LACTATE DEHYDROGENASE: LDH: 251 U/L — ABNORMAL HIGH (ref 94–250)

## 2012-09-16 LAB — PROTEIN / CREATININE RATIO, URINE
Creatinine, Urine: 91.58 mg/dL
Protein Creatinine Ratio: 0.12 (ref 0.00–0.15)
Total Protein, Urine: 10.6 mg/dL

## 2012-09-16 LAB — URINE MICROSCOPIC-ADD ON

## 2012-09-16 LAB — US OB DETAIL + 14 WK

## 2012-09-16 LAB — URIC ACID: Uric Acid, Serum: 6.1 mg/dL (ref 2.4–7.0)

## 2012-09-16 NOTE — Progress Notes (Signed)
Pt had turned to right and BP cuff on upper arm

## 2012-09-16 NOTE — Progress Notes (Signed)
Lying on right side with BP cuff on upper arm, up to BR

## 2012-09-16 NOTE — MAU Provider Note (Signed)
History     CSN: [id number]  Arrival date and time: 09/16/12 1324   First Provider Initiated Contact with Patient 09/16/12 1014      Chief Complaint  Patient presents with  . Hypertension   HPI Jenny Mann is a 32 y.o. [phone] at [redacted]w[redacted]d with triplets who was sent to MAU today from MFM for elevated BPs in pregnancy. Patient had 8/8 BPP on all babies today. She denies headache, RUQ pain, blurred vision, contractions, vaginal bleeding, discharge, LOF, fever or UTI symptoms. She has had minimal LE edema relieved by rest. She reports good fetal movement.   OB History   Grav Para Term Preterm Abortions TAB SAB Ect Mult Living   5 3 3  0 1 1 0 0 0 3      Past Medical History  Diagnosis Date  . Abnormal Pap smear   . Hypertension   . PIH (pregnancy induced hypertension)     ?2nd and 3rd pregnancy  . Anemia September 15 2012    office called her and told her to take Fe    Past Surgical History  Procedure Laterality Date  . Cesarean section      Family History  Problem Relation Age of Onset  . Diabetes Maternal Grandmother   . Diabetes Paternal Grandmother   . Kidney disease Paternal Grandmother     History  Substance Use Topics  . Smoking status: Former Games developer  . Smokeless tobacco: Never Used  . Alcohol Use: No    Allergies:  Allergies  Allergen Reactions  . Shellfish Allergy Anaphylaxis    No prescriptions prior to admission    Review of Systems  Constitutional: Negative for fever and malaise/fatigue.  Eyes: Negative for blurred vision.  Cardiovascular: Positive for leg swelling.  Gastrointestinal: Negative for abdominal pain.  Genitourinary: Negative for dysuria, urgency and frequency.       Neg - vaginal bleeding, discharge, LOF  Neurological: Negative for dizziness.   Physical Exam   Blood pressure 142/73, pulse 93, last menstrual period 01/02/2012, SpO2 99.00%.  Physical Exam  Constitutional: She is oriented to person, place, and time. She  appears well-developed and well-nourished. No distress.  HENT:  Head: Normocephalic and atraumatic.  Cardiovascular: Normal rate, regular rhythm and normal heart sounds.   Respiratory: Effort normal and breath sounds normal. No respiratory distress.  GI: Soft. Bowel sounds are normal. She exhibits no distension and no mass. There is no tenderness. There is no rebound and no guarding.  Musculoskeletal: She exhibits edema (non-pitting mild LE edema). She exhibits no tenderness.  Neurological: She is alert and oriented to person, place, and time. She has normal reflexes.  Skin: Skin is warm and dry. No erythema.  Psychiatric: She has a normal mood and affect.   Results for orders placed during the hospital encounter of 09/16/12 (from the past 24 hour(s))  CBC     Status: Abnormal   Collection Time    09/16/12  9:45 AM      Result Value Range   WBC 9.5  4.0 - 10.5 K/uL   RBC 3.96  3.87 - 5.11 MIL/uL   Hemoglobin 9.7 (*) 12.0 - 15.0 g/dL   HCT 53.6 (*) 64.4 - 03.4 %   MCV 73.5 (*) 78.0 - 100.0 fL   MCH 24.5 (*) 26.0 - 34.0 pg   MCHC 33.3  30.0 - 36.0 g/dL   RDW 74.2  59.5 - 63.8 %   Platelets 151  150 - 400 K/uL  COMPREHENSIVE METABOLIC PANEL     Status: Abnormal   Collection Time    09/16/12  9:45 AM      Result Value Range   Sodium 133 (*) 135 - 145 mEq/L   Potassium 3.1 (*) 3.5 - 5.1 mEq/L   Chloride 100  96 - 112 mEq/L   CO2 24  19 - 32 mEq/L   Glucose, Bld 102 (*) 70 - 99 mg/dL   BUN <3 (*) 6 - 23 mg/dL   Creatinine, Ser 1.61  0.50 - 1.10 mg/dL   Calcium 8.8  8.4 - 09.6 mg/dL   Total Protein 5.7 (*) 6.0 - 8.3 g/dL   Albumin 2.3 (*) 3.5 - 5.2 g/dL   AST 27  0 - 37 U/L   ALT 19  0 - 35 U/L   Alkaline Phosphatase 139 (*) 39 - 117 U/L   Total Bilirubin 0.7  0.3 - 1.2 mg/dL   GFR calc non Af Amer >90  >90 mL/min   GFR calc Af Amer >90  >90 mL/min  URIC ACID     Status: None   Collection Time    09/16/12  9:45 AM      Result Value Range   Uric Acid, Serum 6.1  2.4 - 7.0  mg/dL  LACTATE DEHYDROGENASE     Status: Abnormal   Collection Time    09/16/12  9:45 AM      Result Value Range   LDH 251 (*) 94 - 250 U/L  URINALYSIS, ROUTINE W REFLEX MICROSCOPIC     Status: Abnormal   Collection Time    09/16/12 10:45 AM      Result Value Range   Color, Urine YELLOW  YELLOW   APPearance HAZY (*) CLEAR   Specific Gravity, Urine 1.015  1.005 - 1.030   pH 6.0  5.0 - 8.0   Glucose, UA NEGATIVE  NEGATIVE mg/dL   Hgb urine dipstick TRACE (*) NEGATIVE   Bilirubin Urine NEGATIVE  NEGATIVE   Ketones, ur NEGATIVE  NEGATIVE mg/dL   Protein, ur NEGATIVE  NEGATIVE mg/dL   Urobilinogen, UA 0.2  0.0 - 1.0 mg/dL   Nitrite NEGATIVE  NEGATIVE   Leukocytes, UA LARGE (*) NEGATIVE  PROTEIN / CREATININE RATIO, URINE     Status: None   Collection Time    09/16/12 10:45 AM      Result Value Range   Creatinine, Urine 91.58     Total Protein, Urine 10.6     PROTEIN CREATININE RATIO 0.12  0.00 - 0.15  URINE MICROSCOPIC-ADD ON     Status: Abnormal   Collection Time    09/16/12 10:45 AM      Result Value Range   Squamous Epithelial / LPF FEW (*) RARE   WBC, UA 21-50  <3 WBC/hpf   RBC / HPF 3-6  <3 RBC/hpf   Bacteria, UA FEW (*) RARE    MAU Course  Procedures None  MDM UA, CBC, CMP, Uric acid, LDH, urine protein/creatinine ration Discussed results with DR. Tamela Oddi. Ok for discharge. Monitor symptoms. Call office for follow-up appointment Monday  Assessment and Plan  A: Hypertension in pregnancy  P: Discharge home Pre-eclampsia warning signs discussed Patient advised to call office for follow-up on Monday Patient may return to MAU as needed or if her condition were to change or worsen  Freddi Starr, PA-C  09/16/2012, 4:27 PM

## 2012-09-16 NOTE — MAU Note (Signed)
Pt does not need fetal monitoring at this time due to BPP on triplets was 8/8 on all.  Telephone order read back/Taysom Glymph, RN, no CHL order to cover this order

## 2012-09-16 NOTE — MAU Note (Signed)
Pt sent for [place] , triplets, all BPP 8/8 , here for workup for hypertension, labs entered from visit at MFM. Calling MD to see if monitoring can be waived in light of BPP x 3 8/8

## 2012-09-16 NOTE — Progress Notes (Signed)
Jenny Mann  was seen today for an ultrasound appointment.  See full report in [facility].  Impression: Tri/ Tri triplet gestation with best dates of 32 5/7 weeks Lagging AC on baby A, Chronic hypertension- overall growth appropriate and concordant on 7/9 BPPs 8/8 x 3 Normal amniotic fluid volume x 3  BPs: 146/102, repeat 147/92  Recommendations: Continue weekly BPPs. Follow up growth in 2 weeks. Patient was sent to MAU following her clinic visit for BP checks and labs to rule out preeclampsia (Dr. Tamela Oddi notified)  Alpha Gula, MD

## 2012-09-17 ENCOUNTER — Encounter (HOSPITAL_COMMUNITY): Payer: Self-pay | Admitting: *Deleted

## 2012-09-17 ENCOUNTER — Inpatient Hospital Stay (HOSPITAL_COMMUNITY)
Admission: AD | Admit: 2012-09-17 | Discharge: 2012-09-17 | Disposition: A | Discharge status: Home / Self Care | Payer: Medicaid Other | Source: Ambulatory Visit | Attending: Obstetrics & Gynecology | Admitting: Obstetrics & Gynecology

## 2012-09-17 ENCOUNTER — Ambulatory Visit (INDEPENDENT_AMBULATORY_CARE_PROVIDER_SITE_OTHER): Payer: Medicaid Other | Admitting: Obstetrics & Gynecology

## 2012-09-17 VITALS — BP 138/84 | Temp 97.9°F | Wt 352.0 lb

## 2012-09-17 DIAGNOSIS — Z3483 Encounter for supervision of other normal pregnancy, third trimester: Secondary | ICD-10-CM

## 2012-09-17 DIAGNOSIS — Z3480 Encounter for supervision of other normal pregnancy, unspecified trimester: Secondary | ICD-10-CM

## 2012-09-17 DIAGNOSIS — O30109 Triplet pregnancy, unspecified number of placenta and unspecified number of amniotic sacs, unspecified trimester: Secondary | ICD-10-CM | POA: Insufficient documentation

## 2012-09-17 DIAGNOSIS — O30103 Triplet pregnancy, unspecified number of placenta and unspecified number of amniotic sacs, third trimester: Secondary | ICD-10-CM

## 2012-09-17 DIAGNOSIS — O212 Late vomiting of pregnancy: Secondary | ICD-10-CM | POA: Insufficient documentation

## 2012-09-17 DIAGNOSIS — O219 Vomiting of pregnancy, unspecified: Secondary | ICD-10-CM

## 2012-09-17 DIAGNOSIS — O4703 False labor before 37 completed weeks of gestation, third trimester: Secondary | ICD-10-CM

## 2012-09-17 DIAGNOSIS — O4700 False labor before 37 completed weeks of gestation, unspecified trimester: Secondary | ICD-10-CM | POA: Insufficient documentation

## 2012-09-17 DIAGNOSIS — R109 Unspecified abdominal pain: Secondary | ICD-10-CM | POA: Insufficient documentation

## 2012-09-17 DIAGNOSIS — O479 False labor, unspecified: Secondary | ICD-10-CM

## 2012-09-17 LAB — POCT URINALYSIS DIPSTICK
Glucose, UA: NEGATIVE
Ketones, UA: NEGATIVE

## 2012-09-17 LAB — URINE CULTURE: Colony Count: >=100000

## 2012-09-17 MED ORDER — ONDANSETRON HCL 8 MG PO TABS: 8.0000 mg | ORAL_TABLET | Freq: Three times a day (TID) | ORAL | Status: DC | PRN

## 2012-09-17 MED ORDER — ONDANSETRON 8 MG PO TBDP
8.0000 mg | ORAL_TABLET | Freq: Once | ORAL | Status: AC
  Administered 2012-09-17: 8 mg via ORAL
  Filled 2012-09-17: qty 1

## 2012-09-17 MED ORDER — SULFAMETHOXAZOLE-TMP DS 800-160 MG PO TABS: 1.0000 | ORAL_TABLET | Freq: Two times a day (BID) | ORAL | Status: DC

## 2012-09-17 NOTE — Progress Notes (Signed)
Pulse- 98 Pt states she is having increased pelvis pressure.

## 2012-09-17 NOTE — MAU Note (Signed)
Pt states the cramping is almost gone

## 2012-09-17 NOTE — MAU Note (Signed)
Very difficult to trace the triplets-movement heard with an occasional FHT seen-toco readjusted

## 2012-09-17 NOTE — Progress Notes (Signed)
Unable to perform NST(s).  BPP(s) yesterday reassuring.  Plan weekly BPP(s) until delivery.

## 2012-09-17 NOTE — Patient Instructions (Addendum)
Sterilization Information, Female Female sterilization is a procedure to permanently prevent pregnancy. There are different ways to perform sterilization, but all either block or close the fallopian tubes so that your eggs cannot reach your uterus. If your egg cannot reach your uterus, sperm cannot fertilize the egg, and you cannot get pregnant.  Sterilization is performed by a surgical procedure. Sometimes these procedures are performed in a hospital while a patient is asleep. Sometimes they can be done in a clinic setting with the patient awake. The fallopian tubes can be surgically cut, tied, or sealed through a procedure called tubal ligation. The fallopian tubes can also be closed with clips or rings. Sterilization can also be done by placing a tiny coil into each fallopian tube, which causes scar tissue to grow inside the tube. The scar tissue then blocks the tubes.  Discuss sterilization with your caregiver to answer any concerns you or your partner may have. You may want to ask what type of sterilization your caregiver performs. Some caregivers may not perform all the various options. Sterilization is permanent and should only be done if you are sure you do not want children or do not want any more children. Having a sterilization reversed may not be successful.  STERILIZATION PROCEDURES  Laparoscopic sterilization. This is a surgical method performed at a time other than right after childbirth. Two incisions are made in the lower abdomen. A thin, lighted tube (laparoscope) is inserted into one of the incisions and is used to perform the procedure. The fallopian tubes are closed with a ring or a clip. An instrument that uses heat could be used to seal the tubes closed (electrocautery).   Mini-laparotomy. This is a surgical method done 1 or 2 days after giving birth. Typically, a small incision is made just below the belly button (umbilicus) and the fallopian tubes are exposed. The tubes can then be  sealed, tied, or cut.   Hysteroscopic sterilization. This is performed at a time other than right after childbirth. A tiny, spring-like coil is inserted through the cervix and uterus and placed into the fallopian tubes. The coil causes scaring and blocks the tubes. Other forms of contraception should be used for 3 months after the procedure to allow the scar tissue to form completely. Additionally, it is required hysterosalpingography be done 3 months later to ensure that the procedure was successful. Hysterosalpingography is a procedure that uses X-rays to look at your uterus and fallopian tubes after a material to make them show up better has been inserted. IS STERILIZATION SAFE? Sterilization is considered safe with very rare complications. Risks depend on the type of procedure you have. As with any surgical procedure, there are risks. Some risks of sterilization by any means include:   Bleeding.  Infection.  Reaction to anesthesia medicine.  Injury to surrounding organs. Risks specific to having hysteroscopic coils placed include:  The coils may not be placed correctly the first time.   The coils may move out of place.   The tubes may not get completely blocked after 3 months.   Injury to surrounding organs when placing the coil.  HOW EFFECTIVE IS FEMALE STERILIZATION? Sterilization is nearly 100% effective, but it can fail. Depending on the type of sterilization, the rate of failure can be as high as 3%. After hysteroscopic sterilization with placement of fallopian tube coils, you will need back-up birth control for 3 months after the procedure. Sterilization is effective for a lifetime.  BENEFITS OF STERILIZATION  It does   not affect your hormones, and therefore will not affect your menstrual periods, sexual desire, or performance.   It is effective for a lifetime.   It is safe.   You do not need to worry about getting pregnant. Keep in mind that if you had the  hysteroscopic placement procedure, you must wait 3 months after the procedure (or until your caregiver confirms) before pregnancy is not considered possible.   There are no side effects unlike other types of birth control (contraception).  DRAWBACKS OF STERILIZATION  You must be sure you do not want children or any more children. The procedure is permanent.   It does not provide protection against sexually transmitted infections (STIs).   The tubes can grow back together. If this happens, there is a risk of pregnancy. There is also an increased risk (50%) of pregnancy being an ectopic pregnancy. This is a pregnancy that happens outside of the uterus. Document Released: 08/07/2007 Document Revised: 08/20/2011 Document Reviewed: 06/06/2011 [facility] Patient Information 2014 [place], [place]. Cesarean Delivery  Cesarean delivery is the birth of a baby through a cut (incision) in the abdomen and womb (uterus).  LET YOUR CAREGIVER KNOW ABOUT:  Complicationsinvolving the pregnancy.  Allergies.  Medicines taken including herbs, eyedrops, over-the-counter medicines, and creams.  Use of steroids (by mouth or creams).  Previous problems with anesthetics or numbing medicine.  Previous surgery.  History of blood clots.  History of bleeding or blood problems.  Other health problems. RISKS AND COMPLICATIONS   Bleeding.  Infection.  Blood clots.  Injury to surrounding organs.  Anesthesia problems.  Injury to the baby. BEFORE THE PROCEDURE   A tube (Foley catheter) will be placed in your bladder. The Foley catheter drains the urine from your bladder into a bag. This keeps your bladder empty during surgery.  An intravenous access tube (IV) will be placed in your arm.  Hair may be removed from your pubic area and your lower abdomen. This is to prevent infection in the incision site.  You may be given an antacid medicine to drink. This will prevent acid contents in your stomach  from going into your lungs if you vomit during the surgery.  You may be given an antibiotic medicine to prevent infection. PROCEDURE   You may be given medicine to numb the lower half of your body (regional anesthetic). If you were in labor, you may have already had an epidural in place which can be used in both labor and cesarean delivery. You may possibly be given medicine to make you sleep (general anesthetic) though this is not as common.  An incision will be made in your abdomen that extends to your uterus. There are 2 basic kinds of incisions:  The horizontal (transverse) incision. Horizontal incisions are used for most routine cesarean deliveries.  The vertical (up and down) incision. This is less commonly used. This is most often reserved for women who have a serious complication (extreme prematurity) or under emergency situations.  The horizontal and vertical incisions may both be used at the same time. However, this is very uncommon.  Your baby will then be delivered. AFTER THE PROCEDURE   If you were awake during the surgery, you will see your baby right away. If you were asleep, you will see your baby as soon as you are awake.  You may breastfeed your baby after surgery.  You may be able to get up and walk the same day as the surgery. If you need to stay in  bed for a period of time, you will receive help to turn, cough, and take deep breaths after surgery. This helps prevent lung problems such as pneumonia.  Do not get out of bed alone the first time after surgery. You will need help getting out of bed until you are able to do this by yourself.  You may be able to shower the day after your cesarean delivery. After the bandage (dressing) is taken off the incision site, a nurse will assist you to shower, if you like.  You will have pneumatic compressing hose placed on your feet or lower legs. These hose are used to prevent blood clots. When you are up and walking regularly,  they will no longer be necessary.  Do not cross your legs when you sit.  Save any blood clots that you pass. If you pass a clot while on the toilet, do not flush it. Call for the nurse. Tell the nurse if you think you are bleeding too much or passing too many clots.  Start drinking liquids and eating food as directed by your caregiver. If your stomach is not ready, drinking and eating too soon can cause an increase in bloating and swelling of your intestine and abdomen. This is very uncomfortable.  You will be given medicine as needed. Let your caregivers know if you are hurting. They want you to be comfortable. You may also be given an antibiotic to prevent an infection.  Your IV will be taken out when you are drinking a reasonable amount of fluids. The Foley catheter is taken out when you are up and walking.  If your blood type is Rh negative and your baby's blood type is Rh positive, you will be given a shot of anti-D immune globulin. This shot prevents you from having Rh problems with a future pregnancy. You should get the shot even if you had your tubes tied (tubal ligation).  If you are allowed to take the baby for a walk, place the baby in the bassinet and push it. Do not carry your baby in your arms. Document Released: 02/18/2005 Document Revised: 05/13/2011 Document Reviewed: 06/15/2010 [facility] Patient Information 2014 [place], [place].

## 2012-09-17 NOTE — MAU Provider Note (Signed)
Chief Complaint:  Nausea  First Provider Initiated Contact with Patient 09/17/12 0407    HPI: Jenny Mann is a 32 y.o. [id number] at [redacted]w[redacted]d who presents to maternity admissions reporting cramping, nausea and vomiting times one that awoke her from sleep. Denies fever, chills, urinary complaints, diarrhea, constipation, leakage of fluid or vaginal bleeding. Good fetal movement.   Pregnancy Course: Followed by MFM for triplet gestation and chronic hypertension. BPP 8/8x3 yesterday.  Past Medical History: Past Medical History  Diagnosis Date  . Abnormal Pap smear   . Hypertension   . PIH (pregnancy induced hypertension)     ?2nd and 3rd pregnancy  . Anemia September 15 2012    office called her and told her to take Fe    Past obstetric history: OB History   Grav Para Term Preterm Abortions TAB SAB Ect Mult Living   5 3 3  0 1 1 0 0 0 3     # Outc Date GA Lbr Len/2nd Wgt Sex Del Anes PTL Lv   1 TRM 2/03 [redacted]w[redacted]d  3.572kg(7lb14oz) F LTCS   Yes   Comments: preeclampsia   2 TRM 6/07 [redacted]w[redacted]d  3.6kg(7lb15oz) F LTCS   Yes   3 TRM 6/08    M LTCS  No No   4 TAB            5 CUR               Past Surgical History: Past Surgical History  Procedure Laterality Date  . Cesarean section      Family History: Family History  Problem Relation Age of Onset  . Diabetes Maternal Grandmother   . Diabetes Paternal Grandmother   . Kidney disease Paternal Grandmother     Social History: History  Substance Use Topics  . Smoking status: Former Games developer  . Smokeless tobacco: Never Used  . Alcohol Use: No    Allergies:  Allergies  Allergen Reactions  . Shellfish Allergy Anaphylaxis    Meds:  Prescriptions prior to admission  Medication Sig Dispense Refill  . famotidine (PEPCID AC) 10 MG chewable tablet Chew 20 mg by mouth 2 (two) times daily as needed for heartburn.      . Prenatal Vit-Fe Fumarate-FA (PRENATAL MULTIVITAMIN) TABS Take 1 tablet by mouth daily at 12 noon.        ROS: Pertinent  findings in history of present illness.  Physical Exam  Blood pressure 138/75, pulse 101, temperature 98.2 F (36.8 C), resp. rate 22, height 5\' 3"  (1.6 m), weight 159.836 kg (352 lb 6 oz), last menstrual period 01/02/2012. GENERAL: Well-developed, well-nourished, morbidly obese female in no acute distress.  HEENT: normocephalic HEART: normal rate RESP: normal effort ABDOMEN: Soft, non-tender, gravid appropriate for gestational age EXTREMITIES: Nontender, no edema NEURO: alert and oriented, DTRs 2+, no clonus SPECULUM EXAM: Deferred Dilation: Closed Effacement (%): Thick Exam by:: V.Quadir Muns,CNM  FHT:  Doppler 135/140/140  Contractions: UI   Labs: N/A   Imaging:  N/A   MAU Course: Nausea resolved with Zofran. No further cramping.  Assessment: 1. Preterm contractions, third trimester   2. Triplet pregnancy in third trimester   3. Nausea/vomiting in pregnancy     Plan: Discharge home in stable condition per consult with Dr. Tamela Oddi. Preterm labor precautions and fetal kick counts. Increase fluids and rest.      Follow-up Information   Follow up with [name], MD. (Today as scheduled)    Contact information:   [address]  [address] [place] [address] [phone]       Follow up with [facility] [place] [facility] ADMISS[facility]. (As needed if symptoms worsen)    Contact information:   [address] [id number] [place] [address] [phone]       Medication List         famotidine 10 MG chewable tablet  Commonly known as:  PEPCID AC  Chew 20 mg by mouth 2 (two) times daily as needed for heartburn.     ondansetron 8 MG tablet  Commonly known as:  ZOFRAN  Take 1 tablet (8 mg total) by mouth every 8 (eight) hours as needed for nausea.     prenatal multivitamin Tabs  Take 1 tablet by mouth daily at 12 noon.       [place], CNM 09/17/2012 4:24 AM

## 2012-09-17 NOTE — MAU Note (Signed)
Pt states she woke up with cramping and nausea-the cramping is better-she vomited earlier-and now is just nauseated

## 2012-09-17 NOTE — MAU Note (Signed)
Pt states she feels much better-she is drowsy and relaxed

## 2012-09-18 ENCOUNTER — Encounter: Payer: Self-pay | Admitting: Obstetrics & Gynecology

## 2012-09-18 ENCOUNTER — Other Ambulatory Visit: Payer: Self-pay | Admitting: *Deleted

## 2012-09-20 ENCOUNTER — Encounter (HOSPITAL_COMMUNITY): Payer: Self-pay | Admitting: *Deleted

## 2012-09-20 ENCOUNTER — Inpatient Hospital Stay (HOSPITAL_COMMUNITY)
Admission: AD | Admit: 2012-09-20 | Discharge: 2012-09-21 | Disposition: A | Discharge status: Home / Self Care | Payer: Medicaid Other | Source: Ambulatory Visit | Attending: Obstetrics & Gynecology | Admitting: Obstetrics & Gynecology

## 2012-09-20 DIAGNOSIS — O0990 Supervision of high risk pregnancy, unspecified, unspecified trimester: Secondary | ICD-10-CM

## 2012-09-20 DIAGNOSIS — R51 Headache: Secondary | ICD-10-CM | POA: Insufficient documentation

## 2012-09-20 DIAGNOSIS — R209 Unspecified disturbances of skin sensation: Secondary | ICD-10-CM | POA: Insufficient documentation

## 2012-09-20 DIAGNOSIS — O30109 Triplet pregnancy, unspecified number of placenta and unspecified number of amniotic sacs, unspecified trimester: Secondary | ICD-10-CM | POA: Insufficient documentation

## 2012-09-20 DIAGNOSIS — O10019 Pre-existing essential hypertension complicating pregnancy, unspecified trimester: Secondary | ICD-10-CM | POA: Insufficient documentation

## 2012-09-20 DIAGNOSIS — O99891 Other specified diseases and conditions complicating pregnancy: Secondary | ICD-10-CM | POA: Insufficient documentation

## 2012-09-20 DIAGNOSIS — N390 Urinary tract infection, site not specified: Secondary | ICD-10-CM

## 2012-09-20 DIAGNOSIS — O26893 Other specified pregnancy related conditions, third trimester: Secondary | ICD-10-CM

## 2012-09-20 DIAGNOSIS — D509 Iron deficiency anemia, unspecified: Secondary | ICD-10-CM

## 2012-09-20 LAB — COMPREHENSIVE METABOLIC PANEL
ALT: 19 U/L (ref 0–35)
AST: 31 U/L (ref 0–37)
Albumin: 2.4 g/dL — ABNORMAL LOW (ref 3.5–5.2)
CO2: 25 mEq/L (ref 19–32)
Calcium: 8.7 mg/dL (ref 8.4–10.5)
GFR calc non Af Amer: >90 mL/min (ref >90)
Sodium: 135 mEq/L (ref 135–145)

## 2012-09-20 LAB — URINALYSIS, ROUTINE W REFLEX MICROSCOPIC
Nitrite: NEGATIVE
Protein, ur: NEGATIVE mg/dL
Specific Gravity, Urine: <1.005 — ABNORMAL LOW (ref 1.005–1.030)
Urobilinogen, UA: 1 mg/dL (ref 0.0–1.0)

## 2012-09-20 LAB — URINE MICROSCOPIC-ADD ON

## 2012-09-20 LAB — CBC
MCH: 24.5 pg — ABNORMAL LOW (ref 26.0–34.0)
Platelets: 162 10*3/uL (ref 150–400)
RBC: 3.96 MIL/uL (ref 3.87–5.11)
WBC: 9.7 10*3/uL (ref 4.0–10.5)

## 2012-09-20 MED ORDER — AMOXICILLIN 500 MG PO CAPS
500.0000 mg | ORAL_CAPSULE | Freq: Once | ORAL | Status: AC
  Administered 2012-09-21: 500 mg via ORAL
  Filled 2012-09-20: qty 1

## 2012-09-20 MED ORDER — FLUCONAZOLE 150 MG PO TABS: 150.0000 mg | ORAL_TABLET | Freq: Once | ORAL | Status: DC

## 2012-09-20 MED ORDER — AMOXICILLIN 500 MG PO CAPS: 500.0000 mg | ORAL_CAPSULE | Freq: Three times a day (TID) | ORAL | Status: DC

## 2012-09-20 MED ORDER — BUTALBITAL-APAP-CAFFEINE 50-325-40 MG PO TABS
2.0000 | ORAL_TABLET | Freq: Once | ORAL | Status: AC
  Administered 2012-09-20: 2 via ORAL
  Filled 2012-09-20: qty 2

## 2012-09-20 NOTE — MAU Provider Note (Signed)
History     CSN: [id number]  Arrival date and time: 09/20/12 2116   First Provider Initiated Contact with Patient 09/20/12 2207      Chief Complaint  Patient presents with  . Headache  . Numbness   Headache     Jenny Mann is a 32 y.o. [id number] at [redacted]w[redacted]d with triplets. She presents today with a headache and numbness in her hands and feet. She states that both started when she woke up this morning. She was seen here on 09/17/12, and was called about a UTI. She has not been able to get a RX yet. She states that the babies are moving normally. She denies any UC, VB o r LOF. She has CHTN, but is not on any meds. She has had Pre-E with prior pregnancies. She is scheduled to deliver on 10/12/12.   Past Medical History  Diagnosis Date  . Abnormal Pap smear   . Hypertension   . PIH (pregnancy induced hypertension)     ?2nd and 3rd pregnancy  . Anemia September 15 2012    office called her and told her to take Fe    Past Surgical History  Procedure Laterality Date  . Cesarean section      Family History  Problem Relation Age of Onset  . Diabetes Maternal Grandmother   . Diabetes Paternal Grandmother   . Kidney disease Paternal Grandmother     History  Substance Use Topics  . Smoking status: Former Games developer  . Smokeless tobacco: Never Used  . Alcohol Use: No    Allergies:  Allergies  Allergen Reactions  . Shellfish Allergy Anaphylaxis    Prescriptions prior to admission  Medication Sig Dispense Refill  . famotidine (PEPCID AC) 10 MG chewable tablet Chew 20 mg by mouth 2 (two) times daily as needed for heartburn.      . ondansetron (ZOFRAN) 8 MG tablet Take 1 tablet (8 mg total) by mouth every 8 (eight) hours as needed for nausea.  20 tablet  0  . Prenatal Vit-Fe Fumarate-FA (PRENATAL MULTIVITAMIN) TABS Take 1 tablet by mouth daily at 12 noon.      . sulfamethoxazole-trimethoprim (BACTRIM DS) 800-160 MG per tablet Take 1 tablet by mouth 2 (two) times daily.  14 tablet  0     Review of Systems  Neurological: Positive for headaches.   Physical Exam   Blood pressure 128/79, pulse 95, temperature 98.5 F (36.9 C), temperature source Oral, resp. rate 20, height 5\' 3"  (1.6 m), weight 163.919 kg (361 lb 6 oz), last menstrual period 01/02/2012, SpO2 100.00%.  Physical Exam  Nursing note and vitals reviewed. Constitutional: She is oriented to person, place, and time. She appears well-developed and well-nourished. No distress.  Cardiovascular: Normal rate.   Respiratory: Effort normal.  GI: Soft. There is no tenderness.  Musculoskeletal: Edema: 1+ BLE edema.  Neurological: She is alert and oriented to person, place, and time. She has normal reflexes.  Skin: Skin is warm and dry.   NST reactive x 3 Toco: no UCs MAU Course  Procedures  Results for orders placed during the hospital encounter of 09/20/12 (from the past 24 hour(s))  URINALYSIS, ROUTINE W REFLEX MICROSCOPIC     Status: Abnormal   Collection Time    09/20/12  9:31 PM      Result Value Range   Color, Urine YELLOW  YELLOW   APPearance CLOUDY (*) CLEAR   Specific Gravity, Urine <1.005 (*) 1.005 - 1.030   pH 6.0  5.0 - 8.0   Glucose, UA NEGATIVE  NEGATIVE mg/dL   Hgb urine dipstick TRACE (*) NEGATIVE   Bilirubin Urine NEGATIVE  NEGATIVE   Ketones, ur NEGATIVE  NEGATIVE mg/dL   Protein, ur NEGATIVE  NEGATIVE mg/dL   Urobilinogen, UA 1.0  0.0 - 1.0 mg/dL   Nitrite NEGATIVE  NEGATIVE   Leukocytes, UA LARGE (*) NEGATIVE  URINE MICROSCOPIC-ADD ON     Status: Abnormal   Collection Time    09/20/12  9:31 PM      Result Value Range   Squamous Epithelial / LPF FEW (*) RARE   WBC, UA 3-6  <3 WBC/hpf   RBC / HPF 0-2  <3 RBC/hpf   Bacteria, UA RARE  RARE  CBC     Status: Abnormal   Collection Time    09/20/12 10:16 PM      Result Value Range   WBC 9.7  4.0 - 10.5 K/uL   RBC 3.96  3.87 - 5.11 MIL/uL   Hemoglobin 9.7 (*) 12.0 - 15.0 g/dL   HCT 40.9 (*) 81.1 - 91.4 %   MCV 72.7 (*) 78.0 - 100.0  fL   MCH 24.5 (*) 26.0 - 34.0 pg   MCHC 33.7  30.0 - 36.0 g/dL   RDW 78.2  95.6 - 21.3 %   Platelets 162  150 - 400 K/uL  COMPREHENSIVE METABOLIC PANEL     Status: Abnormal   Collection Time    09/20/12 10:16 PM      Result Value Range   Sodium 135  135 - 145 mEq/L   Potassium 3.3 (*) 3.5 - 5.1 mEq/L   Chloride 102  96 - 112 mEq/L   CO2 25  19 - 32 mEq/L   Glucose, Bld 84  70 - 99 mg/dL   BUN 3 (*) 6 - 23 mg/dL   Creatinine, Ser 0.86  0.50 - 1.10 mg/dL   Calcium 8.7  8.4 - 57.8 mg/dL   Total Protein 5.9 (*) 6.0 - 8.3 g/dL   Albumin 2.4 (*) 3.5 - 5.2 g/dL   AST 31  0 - 37 U/L   ALT 19  0 - 35 U/L   Alkaline Phosphatase 145 (*) 39 - 117 U/L   Total Bilirubin 0.5  0.3 - 1.2 mg/dL   GFR calc non Af Amer >90  >90 mL/min   GFR calc Af Amer >90  >90 mL/min    2324: Spoke with Dr. Clearance Coots, ok for dc home.  Assessment and Plan  Headache in pregnancy  Relieved with Fioricet Pre-X danger signs reviewed FU with [facility] as planned Return to MAU as needed   Tawnya Crook 09/20/2012, 11:21 PM

## 2012-09-20 NOTE — MAU Note (Addendum)
Patient complains of headache that started this morning, woke the patient up out of her sleep. Headache has been off and on all day. Took Tylenol 1g at at 6 this morning and then again at 12 noon. Patient also states both hands and feet are tingling and swollen.

## 2012-09-21 LAB — PROTEIN / CREATININE RATIO, URINE: Protein Creatinine Ratio: 0.09 (ref 0.00–0.15)

## 2012-09-21 MED ORDER — DIPHENHYDRAMINE HCL 25 MG PO CAPS
50.0000 mg | ORAL_CAPSULE | Freq: Once | ORAL | Status: AC
  Administered 2012-09-21: 50 mg via ORAL
  Filled 2012-09-21: qty 2

## 2012-09-22 ENCOUNTER — Other Ambulatory Visit (HOSPITAL_COMMUNITY): Payer: Self-pay | Admitting: Obstetrics and Gynecology

## 2012-09-22 DIAGNOSIS — O10911 Unspecified pre-existing hypertension complicating pregnancy, first trimester: Secondary | ICD-10-CM

## 2012-09-22 DIAGNOSIS — O30102 Triplet pregnancy, unspecified number of placenta and unspecified number of amniotic sacs, second trimester: Secondary | ICD-10-CM

## 2012-09-23 ENCOUNTER — Ambulatory Visit (HOSPITAL_COMMUNITY)
Admission: RE | Admit: 2012-09-23 | Discharge: 2012-09-23 | Disposition: A | Discharge status: Home / Self Care | Payer: Medicaid Other | Source: Ambulatory Visit | Attending: Obstetrics | Admitting: Obstetrics

## 2012-09-23 ENCOUNTER — Other Ambulatory Visit (HOSPITAL_COMMUNITY): Payer: Self-pay | Admitting: Obstetrics and Gynecology

## 2012-09-23 ENCOUNTER — Inpatient Hospital Stay (HOSPITAL_COMMUNITY)
Admission: AD | Admit: 2012-09-23 | Discharge: 2012-09-28 | DRG: 765 | Disposition: A | Discharge status: Home / Self Care | Payer: Medicaid Other | Source: Ambulatory Visit | Attending: Obstetrics | Admitting: Obstetrics

## 2012-09-23 ENCOUNTER — Encounter (HOSPITAL_COMMUNITY): Payer: Self-pay | Admitting: General Practice

## 2012-09-23 VITALS — BP 158/91 | HR 98 | Wt 359.0 lb

## 2012-09-23 DIAGNOSIS — N390 Urinary tract infection, site not specified: Secondary | ICD-10-CM

## 2012-09-23 DIAGNOSIS — O10911 Unspecified pre-existing hypertension complicating pregnancy, first trimester: Secondary | ICD-10-CM

## 2012-09-23 DIAGNOSIS — O3090 Multiple gestation, unspecified, unspecified trimester: Secondary | ICD-10-CM | POA: Insufficient documentation

## 2012-09-23 DIAGNOSIS — O1002 Pre-existing essential hypertension complicating childbirth: Secondary | ICD-10-CM | POA: Diagnosis present

## 2012-09-23 DIAGNOSIS — O30102 Triplet pregnancy, unspecified number of placenta and unspecified number of amniotic sacs, second trimester: Secondary | ICD-10-CM

## 2012-09-23 DIAGNOSIS — Z3689 Encounter for other specified antenatal screening: Secondary | ICD-10-CM | POA: Insufficient documentation

## 2012-09-23 DIAGNOSIS — E669 Obesity, unspecified: Secondary | ICD-10-CM | POA: Insufficient documentation

## 2012-09-23 DIAGNOSIS — O34219 Maternal care for unspecified type scar from previous cesarean delivery: Secondary | ICD-10-CM | POA: Diagnosis present

## 2012-09-23 DIAGNOSIS — O10019 Pre-existing essential hypertension complicating pregnancy, unspecified trimester: Secondary | ICD-10-CM | POA: Insufficient documentation

## 2012-09-23 DIAGNOSIS — O0990 Supervision of high risk pregnancy, unspecified, unspecified trimester: Secondary | ICD-10-CM

## 2012-09-23 DIAGNOSIS — D509 Iron deficiency anemia, unspecified: Secondary | ICD-10-CM

## 2012-09-23 DIAGNOSIS — O365990 Maternal care for other known or suspected poor fetal growth, unspecified trimester, not applicable or unspecified: Principal | ICD-10-CM | POA: Diagnosis present

## 2012-09-23 DIAGNOSIS — O99210 Obesity complicating pregnancy, unspecified trimester: Secondary | ICD-10-CM | POA: Insufficient documentation

## 2012-09-23 DIAGNOSIS — O30109 Triplet pregnancy, unspecified number of placenta and unspecified number of amniotic sacs, unspecified trimester: Secondary | ICD-10-CM | POA: Insufficient documentation

## 2012-09-23 DIAGNOSIS — O10919 Unspecified pre-existing hypertension complicating pregnancy, unspecified trimester: Secondary | ICD-10-CM | POA: Diagnosis present

## 2012-09-23 LAB — URINE MICROSCOPIC-ADD ON

## 2012-09-23 LAB — URINALYSIS, ROUTINE W REFLEX MICROSCOPIC
Glucose, UA: NEGATIVE mg/dL
Hgb urine dipstick: NEGATIVE
Ketones, ur: NEGATIVE mg/dL
Protein, ur: NEGATIVE mg/dL
pH: 5.5 (ref 5.0–8.0)

## 2012-09-23 LAB — CBC
MCH: 24.1 pg — ABNORMAL LOW (ref 26.0–34.0)
MCHC: 33.4 g/dL (ref 30.0–36.0)
MCV: 72.2 fL — ABNORMAL LOW (ref 78.0–100.0)
Platelets: 168 10*3/uL (ref 150–400)
RDW: 13.9 % (ref 11.5–15.5)
WBC: 9.9 10*3/uL (ref 4.0–10.5)

## 2012-09-23 LAB — PROTEIN / CREATININE RATIO, URINE
Creatinine, Urine: 106.45 mg/dL
Protein Creatinine Ratio: 0.11 (ref 0.00–0.15)
Total Protein, Urine: 12.2 mg/dL

## 2012-09-23 LAB — COMPREHENSIVE METABOLIC PANEL
Albumin: 2.3 g/dL — ABNORMAL LOW (ref 3.5–5.2)
BUN: <3 mg/dL — ABNORMAL LOW (ref 6–23)
Calcium: 8.9 mg/dL (ref 8.4–10.5)
Creatinine, Ser: 0.69 mg/dL (ref 0.50–1.10)
GFR calc Af Amer: >90 mL/min (ref >90)
Glucose, Bld: 111 mg/dL — ABNORMAL HIGH (ref 70–99)
Total Protein: 5.5 g/dL — ABNORMAL LOW (ref 6.0–8.3)

## 2012-09-23 MED ORDER — CALCIUM CARBONATE ANTACID 500 MG PO CHEW: 2.0000 | CHEWABLE_TABLET | ORAL | Status: DC | PRN

## 2012-09-23 MED ORDER — FAMOTIDINE 10 MG PO TABS
10.0000 mg | ORAL_TABLET | Freq: Two times a day (BID) | ORAL | Status: DC
  Filled 2012-09-23 (×2): qty 1

## 2012-09-23 MED ORDER — ZOLPIDEM TARTRATE 5 MG PO TABS
5.0000 mg | ORAL_TABLET | Freq: Every evening | ORAL | Status: DC | PRN
  Administered 2012-09-24 (×2): 5 mg via ORAL
  Filled 2012-09-23 (×2): qty 1

## 2012-09-23 MED ORDER — BETAMETHASONE SOD PHOS & ACET 6 (3-3) MG/ML IJ SUSP
12.0000 mg | Freq: Once | INTRAMUSCULAR | Status: AC
  Administered 2012-09-23: 12 mg via INTRAMUSCULAR
  Filled 2012-09-23: qty 2

## 2012-09-23 MED ORDER — ACETAMINOPHEN 325 MG PO TABS: 650.0000 mg | ORAL_TABLET | ORAL | Status: DC | PRN

## 2012-09-23 MED ORDER — AMOXICILLIN 500 MG PO CAPS
500.0000 mg | ORAL_CAPSULE | Freq: Two times a day (BID) | ORAL | Status: DC
  Administered 2012-09-23 – 2012-09-25 (×4): 500 mg via ORAL
  Filled 2012-09-23 (×7): qty 1

## 2012-09-23 MED ORDER — PRENATAL MULTIVITAMIN CH
1.0000 | ORAL_TABLET | Freq: Every day | ORAL | Status: DC
  Administered 2012-09-24: 1 via ORAL
  Filled 2012-09-23: qty 1

## 2012-09-23 MED ORDER — AMOXICILLIN 500 MG PO CAPS
500.0000 mg | ORAL_CAPSULE | Freq: Three times a day (TID) | ORAL | Status: DC
  Administered 2012-09-23: 500 mg via ORAL
  Filled 2012-09-23 (×3): qty 1

## 2012-09-23 MED ORDER — DOCUSATE SODIUM 100 MG PO CAPS
100.0000 mg | ORAL_CAPSULE | Freq: Every day | ORAL | Status: DC
  Administered 2012-09-24: 100 mg via ORAL
  Filled 2012-09-23 (×2): qty 1

## 2012-09-23 MED ORDER — LABETALOL HCL 200 MG PO TABS
200.0000 mg | ORAL_TABLET | Freq: Three times a day (TID) | ORAL | Status: DC
  Administered 2012-09-23 – 2012-09-28 (×13): 200 mg via ORAL
  Filled 2012-09-23 (×18): qty 1

## 2012-09-23 MED ORDER — FAMOTIDINE 20 MG PO TABS
20.0000 mg | ORAL_TABLET | Freq: Two times a day (BID) | ORAL | Status: DC
  Administered 2012-09-23 – 2012-09-28 (×8): 20 mg via ORAL
  Filled 2012-09-23 (×11): qty 1

## 2012-09-23 NOTE — Consult Note (Signed)
[facility]  Neonatal Medicine Consultation       09/23/2012    10:48 PM  I was called at the request of the patient's obstetrician (Dr. Tamela Oddi and Dr. Clearance Coots) to speak to this patient due to impending delivery of 13 1/2 to 34 week triplets.  The patient was admitted today because of chronic hypertension and IUGR of triplet A (female).  The other babies (both males) are growing appropriately.  Decision made by OB team to give a course of betamethasone then perform c/section (planned for day after tomorrow).  I discussed likely NICU course for these babies, who should be at about 34 weeks when delivered.  They should survive, and have uncomplicated hospital courses.  Triplet A is more likely to have immediate problems and a prolonged hospitalization, and she will be at more risk for long-term developmental difficulties.  I discussed how the delivery care would be performed, respiratory distress, feeding difficulties, breast milk feedings (recommended, especially for the girl).  Mom was disappointed to hear they will probably go home at different times, but was overall reassured.  Time spent was 15 minutes face to face, and another 15 minutes reviewing her record and completing this note. _____________________ Electronically Signed By: Angelita Ingles, MD Neonatologist

## 2012-09-23 NOTE — ED Notes (Signed)
Pt states she has been having regular HA's, with swelling and numbness and tingling in extremities.  Was seen in MAU for the same symptoms on 7/20.

## 2012-09-23 NOTE — H&P (Signed)
Jenny Mann is a 32 y.o. female presenting for evaluation of possible PIH. Maternal Medical History:  Reason for admission: [facility]  There was no growth noted for Triplet A-- had been a concern for IUGR.  The pt's blood pressures were elevated.  There is a history of chronic hypertension; no medications were required for control during the pregnancy.  She denies neurological symptoms.   Fetal activity: Perceived fetal activity is normal.    Prenatal complications: PIH.   Infection: asymptomatic bacteruria.   Triplet pregnancy  Prenatal Complications - Diabetes: none.    OB History   Grav Para Term Preterm Abortions TAB SAB Ect Mult Living   5 3 3  0 1 1 0 0 0 3     Past Medical History  Diagnosis Date  . Abnormal Pap smear   . Hypertension   . PIH (pregnancy induced hypertension)     ?2nd and 3rd pregnancy  . Anemia September 15 2012    office called her and told her to take Fe   Past Surgical History  Procedure Laterality Date  . Cesarean section     Family History: family history includes Diabetes in her maternal grandmother and paternal grandmother and Kidney disease in her paternal grandmother. Social History:  reports that she has quit smoking. She has never used smokeless tobacco. She reports that she does not drink alcohol or use illicit drugs.     Review of Systems  Constitutional: Negative for fever.  Eyes: Negative for blurred vision.  Respiratory: Negative for shortness of breath.   Gastrointestinal: Negative for vomiting.  Skin: Negative for rash.  Neurological: Negative for headaches.      Blood pressure 146/64, pulse 111, temperature 97.9 F (36.6 C), temperature source Oral, resp. rate 20, height 5\' 3"  (1.6 m), weight 359 lb (162.841 kg), last menstrual period 01/02/2012. Maternal Exam:  Abdomen: Patient reports no abdominal tenderness. Introitus: not evaluated.   Cervix: not evaluated.   Fetal  Exam Fetal Monitor Review: Variability: moderate (6-25 bpm).   Pattern: accelerations present and no decelerations.    Fetal State Assessment: Category I - tracings are normal.     Physical Exam  Constitutional: She appears well-developed.  HENT:  Head: Normocephalic.  Neck: Neck supple. No thyromegaly present.  Cardiovascular: Normal rate and regular rhythm.   Respiratory: Breath sounds normal.  GI: Soft. Bowel sounds are normal.  Skin: No rash noted.    Prenatal labs: ABO, Rh: --/--/O POS (07/23 1328) Antibody: NEG (07/23 1328) Rubella: 4.04 (02/12 0932) RPR: NON REAC (07/03 1118)  HBsAg: NEGATIVE (02/12 0932)  HIV: NON REACTIVE (07/03 1118)   Results for orders placed during the hospital encounter of 09/23/12 (from the past 24 hour(s))  PROTEIN / CREATININE RATIO, URINE     Status: None   Collection Time    09/23/12  1:20 PM      Result Value Range   Creatinine, Urine 106.45     Total Protein, Urine 12.2     PROTEIN CREATININE RATIO 0.11  0.00 - 0.15  TYPE AND SCREEN     Status: None   Collection Time    09/23/12  1:28 PM      Result Value Range   ABO/RH(D) O POS     Antibody Screen NEG     Sample Expiration 09/26/2012    CBC     Status: Abnormal   Collection Time    09/23/12  1:29  PM      Result Value Range   WBC 9.9  4.0 - 10.5 K/uL   RBC 4.06  3.87 - 5.11 MIL/uL   Hemoglobin 9.8 (*) 12.0 - 15.0 g/dL   HCT 96.0 (*) 45.4 - 09.8 %   MCV 72.2 (*) 78.0 - 100.0 fL   MCH 24.1 (*) 26.0 - 34.0 pg   MCHC 33.4  30.0 - 36.0 g/dL   RDW 11.9  14.7 - 82.9 %   Platelets 168  150 - 400 K/uL  URIC ACID     Status: None   Collection Time    09/23/12  1:29 PM      Result Value Range   Uric Acid, Serum 6.5  2.4 - 7.0 mg/dL  COMPREHENSIVE METABOLIC PANEL     Status: Abnormal   Collection Time    09/23/12  1:29 PM      Result Value Range   Sodium 135  135 - 145 mEq/L   Potassium 3.5  3.5 - 5.1 mEq/L   Chloride 101  96 - 112 mEq/L   CO2 24  19 - 32 mEq/L   Glucose,  Bld 111 (*) 70 - 99 mg/dL   BUN <3 (*) 6 - 23 mg/dL   Creatinine, Ser 5.62  0.50 - 1.10 mg/dL   Calcium 8.9  8.4 - 13.0 mg/dL   Total Protein 5.5 (*) 6.0 - 8.3 g/dL   Albumin 2.3 (*) 3.5 - 5.2 g/dL   AST 29  0 - 37 U/L   ALT 20  0 - 35 U/L   Alkaline Phosphatase 156 (*) 39 - 117 U/L   Total Bilirubin 0.8  0.3 - 1.2 mg/dL   GFR calc non Af Amer >90  >90 mL/min   GFR calc Af Amer >90  >90 mL/min       Assessment/Plan: Multipara with a triplet gestation @ [redacted]w[redacted]d.  Elevated B/Ps likely chronic.  IUGR triplet A.  Asymptomatic bacteriuria incompletely treated.  Admit, betamethasone C/D, BTL in 48 hours    JACKSON-MOORE,Ralston Venus A 09/23/2012, 8:22 PM

## 2012-09-23 NOTE — Progress Notes (Signed)
[facility]  Indication: 32 yr old [id number] at [redacted]w[redacted]d with trichorionic/triamniotic triplet gestation, chronic hypertension, lagging fetal growth in triplet A, and morbid obesity for fetal growth and BPPs.  Findings: 1. Trichorionic/triammniotic triplet gestation; the dividing membranes are seen. 2. Estimated fetal weight for triplet A is in the <10th%; triplet B is in the 68th%; triplet C is in the 42nd%. 3. Triplet A placenta is anterior; triplet B placenta is posterior; triplet C placenta is anterior; there is no evidence of previa. 4. Normal amniotic fluid volume for all fetuses. 5. The limited anatomy survey for all fetuses is normal. 6. Anatomy surveys for all 3 fetuses remains limited. 7. Triplet A is in breech presentation, triplet B is in cephalic presentation, triplet C is in transverse presentation. 8. Normal umbilical artery Doppler studies in triplet A. 9. Normal biophysical profiles of 8/8 for all three fetuses.  Recommendations: 1. Fetal growth restriction in triplet A: - there has been no measurable interval fetal growth in triplet A; given this I recommend admission for course of betamethasone and delivery in steroid window - discussed benefits of betamethasone with the patient and risks of prematurity but given no interval growth and growth restriction feel risk of fetal morbidity and mortality outweighs risks of prematurity at this point 2. Triplet pregnancy: - previously counseled - recommend preterm labor precautions - has planned repeat C section  3. Chronic hypertension: - previously counseled - BP elevated today; recommend evaluation for preeclampsia - treat blood pressures as needed 4. Morbid obesity: - patient understands anatomy surveys remain limited - fetal growth as above  Above discussed with Dr. Tamela Oddi who is in agreement with the plan. Patient was sent to Labor and Delivery for admission and betamethasone and will get  delivered on 09/25/12 or sooner if clinically indicated.  Eulis Foster, MD

## 2012-09-23 NOTE — Progress Notes (Signed)
Patient returned from NICU Tour

## 2012-09-24 ENCOUNTER — Encounter: Payer: Medicaid Other | Admitting: Obstetrics

## 2012-09-24 ENCOUNTER — Inpatient Hospital Stay (HOSPITAL_COMMUNITY): Payer: Medicaid Other

## 2012-09-24 LAB — URINE CULTURE: Colony Count: NO GROWTH

## 2012-09-24 MED ORDER — DEXTROSE 5 % IV SOLN
3.0000 g | INTRAVENOUS | Status: DC
  Filled 2012-09-24 (×2): qty 3000

## 2012-09-24 MED ORDER — BETAMETHASONE SOD PHOS & ACET 6 (3-3) MG/ML IJ SUSP
12.0000 mg | Freq: Once | INTRAMUSCULAR | Status: AC
  Administered 2012-09-24: 12 mg via INTRAMUSCULAR
  Filled 2012-09-24: qty 2

## 2012-09-24 MED ORDER — DEXTROSE 5 % IV SOLN: 500.0000 mg | Freq: Once | INTRAVENOUS | Status: DC

## 2012-09-24 MED ORDER — DEXTROSE 5 % IV SOLN
500.0000 mg | INTRAVENOUS | Status: AC
  Administered 2012-09-25: 500 mg via INTRAVENOUS
  Filled 2012-09-24: qty 500

## 2012-09-24 NOTE — Progress Notes (Signed)
09/24/12 1400  Clinical Encounter Type  Visited With Patient  Visit Type Initial;Spiritual support;Social support  Referral From Nurse (Lanora Manis, RN, NICU director)  Spiritual Encounters  Spiritual Needs Emotional   Visited with Nastacia to introduce spiritual care and chaplain availability in anticipation of c-section and NICU stay for her babies.  She reports good support from family and welcomes chaplain check-ins, naming that follow-up in the NICU may be helpful.  Sonja is particularly worried about baby girl Jewel, who apparently is not gaining weight like the boys are, and she looks forward to skin-to-skin as a way to help support her growth and development.  Spiritual Care will follow for support and encouragement.  [address] [place], [place] [phone]

## 2012-09-24 NOTE — Progress Notes (Signed)
Jenny Mann  was seen today for an ultrasound appointment.  See full report in [facility].  Impression: Tri/ Tri triplet gestation with best dates of 33 6/7 weeks Baby A with suspected fetal growth restriction (<10th %tile), Chronic hypertension- scheduled for C-section tomorrow Completed a course of betamethasone BPPs 8/8 x 3 Normal amniotic fluid volume x 3  Recommendations: Follow-up ultrasounds as clinically indicated.   Alpha Gula, MD

## 2012-09-25 ENCOUNTER — Inpatient Hospital Stay (HOSPITAL_COMMUNITY): Payer: Medicaid Other | Admitting: Anesthesiology

## 2012-09-25 ENCOUNTER — Inpatient Hospital Stay (HOSPITAL_COMMUNITY)
Admission: RE | Admit: 2012-09-25 | Payer: Medicaid Other | Source: Ambulatory Visit | Admitting: Obstetrics & Gynecology

## 2012-09-25 ENCOUNTER — Encounter (HOSPITAL_COMMUNITY): Payer: Self-pay | Admitting: Anesthesiology

## 2012-09-25 ENCOUNTER — Encounter (HOSPITAL_COMMUNITY)
Admission: AD | Disposition: A | Discharge status: Home / Self Care | Payer: Self-pay | Source: Ambulatory Visit | Attending: Obstetrics

## 2012-09-25 DIAGNOSIS — O99214 Obesity complicating childbirth: Secondary | ICD-10-CM

## 2012-09-25 DIAGNOSIS — E669 Obesity, unspecified: Secondary | ICD-10-CM

## 2012-09-25 LAB — CBC
HCT: 26.5 % — ABNORMAL LOW (ref 36.0–46.0)
MCH: 23.6 pg — ABNORMAL LOW (ref 26.0–34.0)
MCH: 23.9 pg — ABNORMAL LOW (ref 26.0–34.0)
MCHC: 32.5 g/dL (ref 30.0–36.0)
MCHC: 32.7 g/dL (ref 30.0–36.0)
MCV: 72.6 fL — ABNORMAL LOW (ref 78.0–100.0)
Platelets: 154 10*3/uL (ref 150–400)
Platelets: 154 10*3/uL (ref 150–400)
RBC: 3.81 MIL/uL — ABNORMAL LOW (ref 3.87–5.11)
RDW: 14.2 % (ref 11.5–15.5)

## 2012-09-25 LAB — CREATININE, SERUM: Creatinine, Ser: 0.65 mg/dL (ref 0.50–1.10)

## 2012-09-25 LAB — TYPE AND SCREEN: ABO/RH(D): O POS

## 2012-09-25 LAB — PREPARE RBC (CROSSMATCH)

## 2012-09-25 SURGERY — Surgical Case
Anesthesia: Spinal | Wound class: Clean Contaminated

## 2012-09-25 MED ORDER — PRENATAL MULTIVITAMIN CH
1.0000 | ORAL_TABLET | Freq: Every day | ORAL | Status: DC
  Administered 2012-09-26 – 2012-09-28 (×3): 1 via ORAL
  Filled 2012-09-25 (×3): qty 1

## 2012-09-25 MED ORDER — LACTATED RINGERS IV SOLN
Freq: Once | INTRAVENOUS | Status: AC
  Administered 2012-09-25: 09:00:00 via INTRAVENOUS

## 2012-09-25 MED ORDER — TRANEXAMIC ACID 100 MG/ML IV SOLN
1000.0000 mg | INTRAVENOUS | Status: AC
  Administered 2012-09-25: 1000 mg via INTRAVENOUS
  Filled 2012-09-25: qty 10

## 2012-09-25 MED ORDER — PHENYLEPHRINE 40 MCG/ML (10ML) SYRINGE FOR IV PUSH (FOR BLOOD PRESSURE SUPPORT)
PREFILLED_SYRINGE | INTRAVENOUS | Status: AC
  Filled 2012-09-25: qty 5

## 2012-09-25 MED ORDER — MORPHINE SULFATE 0.5 MG/ML IJ SOLN
INTRAMUSCULAR | Status: AC
  Filled 2012-09-25: qty 10

## 2012-09-25 MED ORDER — ONDANSETRON HCL 4 MG/2ML IJ SOLN: 4.0000 mg | INTRAMUSCULAR | Status: DC | PRN

## 2012-09-25 MED ORDER — DEXAMETHASONE SODIUM PHOSPHATE 10 MG/ML IJ SOLN
INTRAMUSCULAR | Status: DC | PRN
  Administered 2012-09-25: 5 mg via INTRAVENOUS

## 2012-09-25 MED ORDER — NALOXONE HCL 1 MG/ML IJ SOLN
1.0000 ug/kg/h | INTRAMUSCULAR | Status: DC | PRN
  Filled 2012-09-25: qty 2

## 2012-09-25 MED ORDER — OXYTOCIN 40 UNITS IN LACTATED RINGERS INFUSION - SIMPLE MED
INTRAVENOUS | Status: DC | PRN
  Administered 2012-09-25: 40 [IU] via INTRAVENOUS

## 2012-09-25 MED ORDER — MORPHINE SULFATE (PF) 0.5 MG/ML IJ SOLN
INTRAMUSCULAR | Status: DC | PRN
  Administered 2012-09-25: 1 mg via EPIDURAL
  Administered 2012-09-25: 4 mg via EPIDURAL

## 2012-09-25 MED ORDER — LANOLIN HYDROUS EX OINT: 1.0000 "application " | TOPICAL_OINTMENT | CUTANEOUS | Status: DC | PRN

## 2012-09-25 MED ORDER — SODIUM CHLORIDE 0.9 % IJ SOLN: 3.0000 mL | INTRAMUSCULAR | Status: DC | PRN

## 2012-09-25 MED ORDER — ONDANSETRON HCL 4 MG/2ML IJ SOLN
INTRAMUSCULAR | Status: DC | PRN
  Administered 2012-09-25: 4 mg via INTRAVENOUS

## 2012-09-25 MED ORDER — ONDANSETRON HCL 4 MG/2ML IJ SOLN
INTRAMUSCULAR | Status: AC
  Filled 2012-09-25: qty 2

## 2012-09-25 MED ORDER — FENTANYL CITRATE 0.05 MG/ML IJ SOLN
INTRAMUSCULAR | Status: AC
  Filled 2012-09-25: qty 2

## 2012-09-25 MED ORDER — LACTATED RINGERS IV SOLN
INTRAVENOUS | Status: DC | PRN
  Administered 2012-09-25: 14:00:00 via INTRAVENOUS

## 2012-09-25 MED ORDER — DIPHENHYDRAMINE HCL 50 MG/ML IJ SOLN: 25.0000 mg | INTRAMUSCULAR | Status: DC | PRN

## 2012-09-25 MED ORDER — EPHEDRINE 5 MG/ML INJ
INTRAVENOUS | Status: AC
  Filled 2012-09-25: qty 10

## 2012-09-25 MED ORDER — LACTATED RINGERS IV SOLN
INTRAVENOUS | Status: DC
  Administered 2012-09-25 – 2012-09-26 (×2): via INTRAVENOUS

## 2012-09-25 MED ORDER — PHENYLEPHRINE HCL 10 MG/ML IJ SOLN
INTRAMUSCULAR | Status: DC | PRN
  Administered 2012-09-25 (×2): 80 ug via INTRAVENOUS
  Administered 2012-09-25 (×5): 40 ug via INTRAVENOUS
  Administered 2012-09-25: 80 ug via INTRAVENOUS
  Administered 2012-09-25 (×3): 40 ug via INTRAVENOUS
  Administered 2012-09-25 (×3): 80 ug via INTRAVENOUS

## 2012-09-25 MED ORDER — FENTANYL CITRATE 0.05 MG/ML IJ SOLN: 25.0000 ug | INTRAMUSCULAR | Status: DC | PRN

## 2012-09-25 MED ORDER — SCOPOLAMINE 1 MG/3DAYS TD PT72: 1.0000 | MEDICATED_PATCH | Freq: Once | TRANSDERMAL | Status: DC

## 2012-09-25 MED ORDER — SODIUM BICARBONATE 8.4 % IV SOLN
INTRAVENOUS | Status: DC | PRN
  Administered 2012-09-25: 3 mL via EPIDURAL

## 2012-09-25 MED ORDER — IBUPROFEN 600 MG PO TABS
600.0000 mg | ORAL_TABLET | Freq: Four times a day (QID) | ORAL | Status: DC
  Administered 2012-09-26 – 2012-09-28 (×11): 600 mg via ORAL
  Filled 2012-09-25 (×11): qty 1

## 2012-09-25 MED ORDER — METOCLOPRAMIDE HCL 5 MG/ML IJ SOLN
INTRAMUSCULAR | Status: AC
  Filled 2012-09-25: qty 2

## 2012-09-25 MED ORDER — SIMETHICONE 80 MG PO CHEW
80.0000 mg | CHEWABLE_TABLET | Freq: Three times a day (TID) | ORAL | Status: DC
  Administered 2012-09-25 – 2012-09-28 (×9): 80 mg via ORAL

## 2012-09-25 MED ORDER — DIPHENHYDRAMINE HCL 50 MG/ML IJ SOLN: 12.5000 mg | INTRAMUSCULAR | Status: DC | PRN

## 2012-09-25 MED ORDER — 0.9 % SODIUM CHLORIDE (POUR BTL) OPTIME
TOPICAL | Status: DC | PRN
  Administered 2012-09-25: 1000 mL

## 2012-09-25 MED ORDER — KETOROLAC TROMETHAMINE 30 MG/ML IJ SOLN: 30.0000 mg | Freq: Four times a day (QID) | INTRAMUSCULAR | Status: DC | PRN

## 2012-09-25 MED ORDER — OXYTOCIN 40 UNITS IN LACTATED RINGERS INFUSION - SIMPLE MED: 62.5000 mL/h | INTRAVENOUS | Status: AC

## 2012-09-25 MED ORDER — SENNOSIDES-DOCUSATE SODIUM 8.6-50 MG PO TABS
2.0000 | ORAL_TABLET | Freq: Every day | ORAL | Status: DC
  Administered 2012-09-25 – 2012-09-27 (×3): 2 via ORAL

## 2012-09-25 MED ORDER — METOCLOPRAMIDE HCL 5 MG/ML IJ SOLN: 10.0000 mg | Freq: Three times a day (TID) | INTRAMUSCULAR | Status: DC | PRN

## 2012-09-25 MED ORDER — DIPHENHYDRAMINE HCL 25 MG PO CAPS
25.0000 mg | ORAL_CAPSULE | ORAL | Status: DC | PRN
  Filled 2012-09-25: qty 1

## 2012-09-25 MED ORDER — OXYTOCIN 10 UNIT/ML IJ SOLN
INTRAMUSCULAR | Status: AC
  Filled 2012-09-25: qty 4

## 2012-09-25 MED ORDER — DIPHENHYDRAMINE HCL 25 MG PO CAPS
25.0000 mg | ORAL_CAPSULE | Freq: Four times a day (QID) | ORAL | Status: DC | PRN
  Administered 2012-09-25 – 2012-09-26 (×3): 25 mg via ORAL
  Filled 2012-09-25 (×3): qty 1

## 2012-09-25 MED ORDER — SIMETHICONE 80 MG PO CHEW
80.0000 mg | CHEWABLE_TABLET | ORAL | Status: DC | PRN
  Administered 2012-09-27: 80 mg via ORAL

## 2012-09-25 MED ORDER — DEXTROSE 5 % IV SOLN
3.0000 g | INTRAVENOUS | Status: AC
  Administered 2012-09-25: 3 g via INTRAVENOUS
  Filled 2012-09-25: qty 3000

## 2012-09-25 MED ORDER — EPHEDRINE SULFATE 50 MG/ML IJ SOLN
INTRAMUSCULAR | Status: DC | PRN
Start: 2012-09-25 — End: 2012-09-25
  Administered 2012-09-25 (×5): 10 mg via INTRAVENOUS

## 2012-09-25 MED ORDER — TETANUS-DIPHTH-ACELL PERTUSSIS 5-2.5-18.5 LF-MCG/0.5 IM SUSP: 0.5000 mL | Freq: Once | INTRAMUSCULAR | Status: DC

## 2012-09-25 MED ORDER — DEXAMETHASONE SODIUM PHOSPHATE 10 MG/ML IJ SOLN
INTRAMUSCULAR | Status: AC
  Filled 2012-09-25: qty 1

## 2012-09-25 MED ORDER — NALOXONE HCL 0.4 MG/ML IJ SOLN: 0.4000 mg | INTRAMUSCULAR | Status: DC | PRN

## 2012-09-25 MED ORDER — DIBUCAINE 1 % RE OINT: 1.0000 "application " | TOPICAL_OINTMENT | RECTAL | Status: DC | PRN

## 2012-09-25 MED ORDER — OXYCODONE-ACETAMINOPHEN 5-325 MG PO TABS
1.0000 | ORAL_TABLET | ORAL | Status: DC | PRN
  Administered 2012-09-26: 1 via ORAL
  Administered 2012-09-27 (×2): 2 via ORAL
  Administered 2012-09-27 (×2): 1 via ORAL
  Filled 2012-09-25 (×4): qty 1
  Filled 2012-09-25: qty 2
  Filled 2012-09-25: qty 1
  Filled 2012-09-25: qty 2

## 2012-09-25 MED ORDER — ENOXAPARIN SODIUM 40 MG/0.4ML ~~LOC~~ SOLN
40.0000 mg | SUBCUTANEOUS | Status: DC
  Administered 2012-09-26 – 2012-09-28 (×3): 40 mg via SUBCUTANEOUS
  Filled 2012-09-25 (×4): qty 0.4

## 2012-09-25 MED ORDER — CITRIC ACID-SODIUM CITRATE 334-500 MG/5ML PO SOLN
ORAL | Status: AC
  Administered 2012-09-25: 30 mL
  Filled 2012-09-25: qty 15

## 2012-09-25 MED ORDER — NALBUPHINE SYRINGE 5 MG/0.5 ML
5.0000 mg | INJECTION | INTRAMUSCULAR | Status: DC | PRN
  Filled 2012-09-25: qty 1

## 2012-09-25 MED ORDER — METOCLOPRAMIDE HCL 5 MG/ML IJ SOLN
INTRAMUSCULAR | Status: DC | PRN
  Administered 2012-09-25: 10 mg via INTRAVENOUS

## 2012-09-25 MED ORDER — LACTATED RINGERS IV SOLN
INTRAVENOUS | Status: DC | PRN
  Administered 2012-09-25 (×2): via INTRAVENOUS

## 2012-09-25 MED ORDER — ZOLPIDEM TARTRATE 5 MG PO TABS
5.0000 mg | ORAL_TABLET | Freq: Every evening | ORAL | Status: DC | PRN
  Administered 2012-09-27 – 2012-09-28 (×2): 5 mg via ORAL
  Filled 2012-09-25 (×2): qty 1

## 2012-09-25 MED ORDER — ONDANSETRON HCL 4 MG PO TABS: 4.0000 mg | ORAL_TABLET | ORAL | Status: DC | PRN

## 2012-09-25 MED ORDER — WITCH HAZEL-GLYCERIN EX PADS: 1.0000 "application " | MEDICATED_PAD | CUTANEOUS | Status: DC | PRN

## 2012-09-25 MED ORDER — SCOPOLAMINE 1 MG/3DAYS TD PT72
1.0000 | MEDICATED_PATCH | Freq: Once | TRANSDERMAL | Status: DC
  Filled 2012-09-25: qty 1

## 2012-09-25 MED ORDER — BUPIVACAINE IN DEXTROSE 0.75-8.25 % IT SOLN
INTRATHECAL | Status: DC | PRN
  Administered 2012-09-25: 1 mL via INTRATHECAL

## 2012-09-25 MED ORDER — FENTANYL CITRATE 0.05 MG/ML IJ SOLN
INTRAMUSCULAR | Status: DC | PRN
  Administered 2012-09-25: 100 ug via EPIDURAL

## 2012-09-25 MED ORDER — MEPERIDINE HCL 25 MG/ML IJ SOLN: 6.2500 mg | INTRAMUSCULAR | Status: DC | PRN

## 2012-09-25 MED ORDER — ONDANSETRON HCL 4 MG/2ML IJ SOLN: 4.0000 mg | Freq: Three times a day (TID) | INTRAMUSCULAR | Status: DC | PRN

## 2012-09-25 MED ORDER — LACTATED RINGERS IV SOLN
INTRAVENOUS | Status: DC
  Administered 2012-09-25: 07:00:00 via INTRAVENOUS

## 2012-09-25 MED ORDER — MENTHOL 3 MG MT LOZG: 1.0000 | LOZENGE | OROMUCOSAL | Status: DC | PRN

## 2012-09-25 SURGICAL SUPPLY — 43 items
APL SKNCLS STERI-STRIP NONHPOA (GAUZE/BANDAGES/DRESSINGS) ×1
BENZOIN TINCTURE PRP APPL 2/3 (GAUZE/BANDAGES/DRESSINGS) ×2 IMPLANT
BRR ADH 6X5 SEPRAFILM 1 SHT (MISCELLANEOUS) ×1
CANISTER WOUND CARE 500ML ATS (WOUND CARE) ×1 IMPLANT
CLAMP CORD UMBIL (MISCELLANEOUS) ×3 IMPLANT
CLOTH BEACON ORANGE TIMEOUT ST (SAFETY) ×2 IMPLANT
CONTAINER PREFILL 10% NBF 15ML (MISCELLANEOUS) IMPLANT
DRAPE LG THREE QUARTER DISP (DRAPES) ×2 IMPLANT
DRSG OPSITE POSTOP 4X10 (GAUZE/BANDAGES/DRESSINGS) ×2 IMPLANT
DRSG VAC ATS LRG SENSATRAC (GAUZE/BANDAGES/DRESSINGS) IMPLANT
DRSG VAC ATS MED SENSATRAC (GAUZE/BANDAGES/DRESSINGS) IMPLANT
DRSG VAC ATS SM SENSATRAC (GAUZE/BANDAGES/DRESSINGS) IMPLANT
DURAPREP 26ML APPLICATOR (WOUND CARE) ×2 IMPLANT
ELECT REM PT RETURN 9FT ADLT (ELECTROSURGICAL) ×2
ELECTRODE REM PT RTRN 9FT ADLT (ELECTROSURGICAL) ×1 IMPLANT
EXTRACTOR VACUUM M CUP 4 TUBE (SUCTIONS) IMPLANT
GLOVE BIO SURGEON STRL SZ 6.5 (GLOVE) ×2 IMPLANT
GOWN STRL REIN XL XLG (GOWN DISPOSABLE) ×4 IMPLANT
KIT ABG SYR 3ML LUER SLIP (SYRINGE) IMPLANT
NDL HYPO 25X5/8 SAFETYGLIDE (NEEDLE) ×1 IMPLANT
NEEDLE HYPO 25X5/8 SAFETYGLIDE (NEEDLE) ×2 IMPLANT
NS IRRIG 1000ML POUR BTL (IV SOLUTION) ×2 IMPLANT
PACK C SECTION WH (CUSTOM PROCEDURE TRAY) ×2 IMPLANT
PAD OB MATERNITY 4.3X12.25 (PERSONAL CARE ITEMS) ×2 IMPLANT
RTRCTR C-SECT PINK 25CM LRG (MISCELLANEOUS) IMPLANT
SCRUB PCMX 4 OZ (MISCELLANEOUS) ×2 IMPLANT
SEPRAFILM MEMBRANE 5X6 (MISCELLANEOUS) ×1 IMPLANT
STAPLER VISISTAT 35W (STAPLE) ×1 IMPLANT
STRIP CLOSURE SKIN 1/2X4 (GAUZE/BANDAGES/DRESSINGS) ×2 IMPLANT
SUT MNCRL 0 VIOLET CTX 36 (SUTURE) ×2 IMPLANT
SUT MNCRL AB 3-0 PS2 27 (SUTURE) IMPLANT
SUT MONOCRYL 0 CTX 36 (SUTURE) ×2
SUT PDS AB 0 CTX 36 PDP370T (SUTURE) ×2 IMPLANT
SUT PLAIN 0 NONE (SUTURE) IMPLANT
SUT VIC AB 0 CTXB 36 (SUTURE) IMPLANT
SUT VIC AB 2-0 CT1 (SUTURE) ×2 IMPLANT
SUT VIC AB 2-0 CT1 27 (SUTURE) ×2
SUT VIC AB 2-0 CT1 TAPERPNT 27 (SUTURE) ×1 IMPLANT
SUT VIC AB 2-0 SH 27 (SUTURE)
SUT VIC AB 2-0 SH 27XBRD (SUTURE) IMPLANT
TOWEL OR 17X24 6PK STRL BLUE (TOWEL DISPOSABLE) ×6 IMPLANT
TRAY FOLEY CATH 14FR (SET/KITS/TRAYS/PACK) ×2 IMPLANT
WATER STERILE IRR 1000ML POUR (IV SOLUTION) ×2 IMPLANT

## 2012-09-25 NOTE — Progress Notes (Signed)
To OR for C-section

## 2012-09-25 NOTE — Progress Notes (Signed)
Anesthesia and PharmD notified of patient's allergy to shellfish, and both give OK to give betadine nasal swabs.
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
## 2012-09-25 NOTE — Progress Notes (Signed)
Epidural catheter removed, tip intact, site patent, dry, no drainage. Pt tolerated procedure well.

## 2012-09-25 NOTE — Anesthesia Preprocedure Evaluation (Addendum)
Anesthesia Evaluation  Patient identified by MRN, date of birth, ID band Patient awake    Reviewed: Allergy & Precautions, H&P , NPO status , Patient's Chart, lab work & pertinent test results  Airway Mallampati: III TM Distance: >3 FB Neck ROM: Full    Dental no notable dental hx. (+) Teeth Intact and Chipped   Pulmonary neg pulmonary ROS, former smoker,  breath sounds clear to auscultation  Pulmonary exam normal       Cardiovascular hypertension, Pt. on medications and Pt. on home beta blockers Rhythm:Regular Rate:Normal  Hx/o PIH prior pregnancy   Neuro/Psych negative neurological ROS  negative psych ROS   GI/Hepatic negative GI ROS, Neg liver ROS,   Endo/Other  Morbid obesity  Renal/GU negative Renal ROS  negative genitourinary   Musculoskeletal negative musculoskeletal ROS (+)   Abdominal (+) + obese,   Peds  Hematology  (+) Blood dyscrasia, Sickle cell trait and anemia ,   Anesthesia Other Findings   Reproductive/Obstetrics Previous C/Section Triplet gestation 36 weeks                          Anesthesia Physical Anesthesia Plan  ASA: III  Anesthesia Plan: Combined Spinal and Epidural and Spinal   Post-op Pain Management:    Induction:   Airway Management Planned: Natural Airway  Additional Equipment:   Intra-op Plan:   Post-operative Plan: Extubation in OR  Informed Consent: I have reviewed the patients History and Physical, chart, labs and discussed the procedure including the risks, benefits and alternatives for the proposed anesthesia with the patient or authorized representative who has indicated his/her understanding and acceptance.   Dental advisory given  Plan Discussed with: Anesthesiologist, CRNA and Surgeon  Anesthesia Plan Comments:         Anesthesia Quick Evaluation

## 2012-09-25 NOTE — Op Note (Signed)
Cesarean Section Procedure Note   [name]   09/23/2012 - 09/25/2012  Indications: Triplet gestation @ 34 weeks, IUGR triplet A, gestational hypertension   Pre-operative Diagnosis: Triplet gestation @ 8 weeks, IUGR triplet A, gestational hypertension  Post-operative Diagnosis: Same   Surgeon: Antionette Char A  Assistants: Coral Ceo A  Anesthesia: spinal  Procedure Details:  The patient was seen in the Holding Room. The risks, benefits, complications, treatment options, and expected outcomes were discussed with the patient. The patient concurred with the proposed plan, giving informed consent. The patient was identified as Jenny Mann and the procedure verified as C-Section Delivery. A Time Out was held and the above information confirmed.  After induction of anesthesia, the patient was draped and prepped in the usual sterile manner. A midline incision was made and carried down through the subcutaneous tissue to the fascia. The fascial incision was made and extended.  The peritoneum was identified and entered. The peritoneal incision was extended longitudinally.  A vertical uterine incision was made. Delivered as complete breech extractions were three living newborn infants. APGAR (1 MIN):    Kalliope, Riesen Girl A [place] [[id number]]  8   Raejean, Swinford [place] [[id number]]  8   Clarke, Amburn [place] [[id number]]  7   APGAR (5 MINS):    Zyanya, Glaza [[id number]]  [address] [place] [[id number]]  2   Estalene, Bergey [facility] [[id number]]  9   APGAR (10 MINS):    [place], Girl A [place] [[id number]]    Karianna, Gusman [place] [[id number]]  [address] [place] [[id number]]     A cord ph was not sent. The umbilical cords were clamped and cut cord. Samples were obtained for evaluation. The placenta was removed Intact and appeared normal.  The uterine incision was closed with running locked sutures of 1-0 Monocryl. A second imbricating layer of the same suture was  placed.  Hemostasis was observed. Seprafilm was place over the uterine incision.   The fascia was then reapproximated with running sutures of 1-0 PDS.  A running suture of 2-0 Vicryl was placed in the subcutaneous layer.  The skin was closed with staples. A negative pressure dressing was applied.   Instrument, sponge, and needle counts were correct prior the abdominal closure and were correct at the conclusion of the case.    Findings:  See above   Estimated Blood Loss: 600 ml   Total IV Fluids: per Anesthesiology  Urine Output: per Anesthesiology  Specimens: Placenta(s) to Pathology  Complications: no complications  Disposition: PACU - hemodynamically stable.  Maternal Condition: stable   Baby condition / location:  NICU    Signed: Surgeon(s): Antionette Char, MD

## 2012-09-25 NOTE — Transfer of Care (Signed)
Immediate Anesthesia Transfer of Care Note  Patient: Jenny Mann  Procedure(s) Performed: Procedure(s): REPEAT  CESAREAN SECTION/TRIPLETS (N/A)  Patient Location: PACU  Anesthesia Type:Epidural  Level of Consciousness: awake, alert , oriented and patient cooperative  Airway & Oxygen Therapy: Patient Spontanous Breathing  Post-op Assessment: Report given to PACU RN, Post -op Vital signs reviewed and stable and Patient moving all extremities X 4  Post vital signs: Reviewed and stable  Complications: No apparent anesthesia complications

## 2012-09-25 NOTE — Anesthesia Postprocedure Evaluation (Signed)
Anesthesia Post Note  Patient: Jenny Mann  Procedure(s) Performed: Procedure(s) (LRB): REPEAT  CESAREAN SECTION/TRIPLETS (N/A)  Anesthesia type: Spinal  Patient location: PACU  Post pain: Pain level controlled  Post assessment: Post-op Vital signs reviewed  Last Vitals:  Filed Vitals:   09/25/12 1545  BP: 135/113  Pulse: 92  Temp:   Resp: 21    Post vital signs: Reviewed  Level of consciousness: awake  Complications: No apparent anesthesia complications

## 2012-09-25 NOTE — Progress Notes (Signed)
OB- Dr. Coral Ceo present at time of delivery for delivery assist at 2:18; as part of permanent delivery record.

## 2012-09-25 NOTE — Anesthesia Procedure Notes (Signed)
Spinal  Patient location during procedure: OR Start time: 09/25/2012 1:41 PM End time: 09/25/2012 1:44 PM Staffing Anesthesiologist: Sandrea Hughs Performed by: anesthesiologist  Preanesthetic Checklist Completed: patient identified, surgical consent, pre-op evaluation, timeout performed, IV checked, risks and benefits discussed and monitors and equipment checked Spinal Block Patient position: sitting Prep: DuraPrep Patient monitoring: cardiac monitor, continuous pulse ox, blood pressure and heart rate Approach: midline Location: L3-4 Injection technique: catheter Needle Needle type: Tuohy and Sprotte  Needle gauge: 24 G Needle length: 12.7 cm Needle insertion depth: 10 cm Catheter type: closed end flexible Catheter size: 19 g Catheter at skin depth: 5 cm Assessment Sensory level: T4

## 2012-09-26 LAB — CBC
HCT: 27.4 % — ABNORMAL LOW (ref 36.0–46.0)
MCHC: 32.5 g/dL (ref 30.0–36.0)
Platelets: 150 10*3/uL (ref 150–400)
RDW: 14.2 % (ref 11.5–15.5)

## 2012-09-26 NOTE — Progress Notes (Signed)
Patient ID: Jenny Mann, female   DOB: 1980-04-06, 32 y.o.   MRN: [id number] Postop day 1 Vital signs normal This and drainage by the umbilicus also the dressing was reinforced Patient has no complaints

## 2012-09-26 NOTE — Progress Notes (Addendum)
Wound Vac dressing  1/2 dressing saturated with serosanguineous drainage and draining around and out of dressing.  Pressure dressing applied.

## 2012-09-26 NOTE — Anesthesia Postprocedure Evaluation (Signed)
  Anesthesia Post-op Note  Patient: Jenny Mann  Procedure(s) Performed: Procedure(s): REPEAT  CESAREAN SECTION/TRIPLETS (N/A)  Patient Location: [facility]  Anesthesia Type:Spinal  Level of Consciousness: awake, alert  and oriented  Airway and Oxygen Therapy: Patient Spontanous Breathing  Post-op Pain: mild  Post-op Assessment: Post-op Vital signs reviewed, Patient's Cardiovascular Status Stable, No headache, No backache, No residual numbness and No residual motor weakness  Post-op Vital Signs: Reviewed and stable  Complications: No apparent anesthesia complications

## 2012-09-26 NOTE — Progress Notes (Signed)
Wound vac dressing completely saturated and continuously  leaking serosanguinous drainage at the bottom of dressing. Company notified and stated to change the dressing. Dressing changed without difficulty. Wound vac appears to functioning properly.

## 2012-09-27 NOTE — Clinical Social Work Note (Signed)
Clinical Social Work Department PSYCHOSOCIAL ASSESSMENT - MATERNAL/CHILD 09/27/2012  Patient:  Jenny Mann,Jenny Mann  Account Number:  [id number]  Admit Date:  09/23/2012  Childs Name:   Jenny Mann, Jenny Mann, and Jenny Mann    Clinical Social Worker:  Cassian Torelli, LCSW   Date/Time:  09/27/2012 01:30 PM  Date Referred:  09/27/2012   Referral source  Physician     Referred reason  NICU   Other referral source:    I:  FAMILY / HOME ENVIRONMENT Child's legal guardian:  PARENT  Guardian - Name Guardian - Age Guardian - Address  [name] [place], [place] [address]  Jenny Mann Mann  [address] [place], [place] [address]   Other household support members/support persons Name Relationship DOB  31 yo girl    31 yo girl     Other support:   MOB reports good family support    II  PSYCHOSOCIAL DATA Information Source:  Patient Interview  Financial and Community Resources Employment:   MOB works at hair salon  FOB: [employer]   Financial resources:  Medicaid If Medicaid - County:  [place] Other  Food Stamps   School / Grade:   Maternity Care Coordinator / Child Services Coordination / Early Interventions:  Cultural issues impacting care:    III  STRENGTHS Strengths  Adequate Resources  Home prepared for Child (including basic supplies)  Supportive family/friends  Understanding of illness  Compliance with medical plan   Strength comment:    IV  RISK FACTORS AND CURRENT PROBLEMS Current Problem:  None   Risk Factor & Current Problem Patient Issue Family Issue Risk Factor / Current Problem Comment   N N     V  SOCIAL WORK ASSESSMENT CSW met with MOB in room.  CSW discussed infant admission to NICU.  MOB reports appropriate emotions around admission and feels she has good communication with doctors and nurses. CSW discussed with MOB any emotional concerns.  MOB reports no significant emotional concerns at this time. CSW discussed PPD. MOB  reports knowing what symptoms to look out for. MOB reports having two other children ages 11 and 7.  MOB reports having triplets was unexpectated as was the pregnancy, however she feels she has good support.  CSW discussed supplies and family support.  MOB reports good family support.  MOB reports having car seats however she does not have the bases for the carseat. CSW discussed looking at resources.  CSW discussed medicaid.  MOB reports no concerns and states she is applying for WIC. MOB is deciding about loaner pump and will talk with lactation. No other social concerns at this time.  MOB was instructed to let RN or CSW know if any further concerns arise.  CSW continues to follow while infant in NICU.      VI SOCIAL WORK PLAN Social Work Plan  Psychosocial Support/Ongoing Assessment of Needs   Type of pt/family education:   PPD symptoms   If child protective services report - county:   If child protective services report - date:   Information/referral to community resources comment:   Other social work plan:    

## 2012-09-27 NOTE — Progress Notes (Signed)
Patient ID: Jenny Mann, female   DOB: [birth year], 32 y.o.   MRN: [id number] Postpartum day 2 Bile signs normal Incision dry Lochia moderate Legs negative doing well

## 2012-09-27 NOTE — Lactation Note (Signed)
This note was copied from the chart of Jenny Mann. Lactation Consultation Note  Patient Name: Jenny Mann Today's Date: 09/27/2012 Reason for consult: Follow-up assessment Per mom pumping every 3 hour , the most volume 40 ml . Denies engorgement. LC checked breast full , not engorged.  Praised mom for pumping and being consistent.  Per mom isn't active with WIC, but has spoke with the NICU social worked  and she plans to contact WIC for mom on Monday. Encouraged mom to get naps and rest when she can.    Maternal Data    Feeding Feeding Type: Formula Nipple Type: Slow - flow Length of feed: 30 min  LATCH Score/Interventions          Comfort (Breast/Nipple):  (per mom breast are fuller , no engorgement noted )           Lactation Tools Discussed/Used Tools: Pump Breast pump type: Double-Electric Breast Pump   Consult Status Consult Status: Follow-up (see LC note ) Date: 09/28/12 Follow-up type: In-patient    Breslin Burklow Ann 09/27/2012, 1:08 PM    

## 2012-09-28 ENCOUNTER — Encounter (HOSPITAL_COMMUNITY): Payer: Self-pay | Admitting: Obstetrics & Gynecology

## 2012-09-28 LAB — CBC WITH DIFFERENTIAL/PLATELET
Basophils Absolute: 0 10*3/uL (ref 0.0–0.1)
Eosinophils Absolute: 0.3 10*3/uL (ref 0.0–0.7)
Eosinophils Relative: 3 % (ref 0–5)
MCH: 24.1 pg — ABNORMAL LOW (ref 26.0–34.0)
MCV: 72.2 fL — ABNORMAL LOW (ref 78.0–100.0)
Platelets: 174 10*3/uL (ref 150–400)
RDW: 14.7 % (ref 11.5–15.5)
WBC: 12.6 10*3/uL — ABNORMAL HIGH (ref 4.0–10.5)

## 2012-09-28 MED ORDER — LABETALOL HCL 200 MG PO TABS: 200.0000 mg | ORAL_TABLET | Freq: Three times a day (TID) | ORAL | Status: DC

## 2012-09-28 MED ORDER — FERROUS SULFATE 325 (65 FE) MG PO TABS: 325.0000 mg | ORAL_TABLET | Freq: Two times a day (BID) | ORAL | Status: DC

## 2012-09-28 MED ORDER — OXYCODONE-ACETAMINOPHEN 5-325 MG PO TABS: 2.0000 | ORAL_TABLET | Freq: Four times a day (QID) | ORAL | Status: DC | PRN

## 2012-09-28 NOTE — Progress Notes (Signed)
Telephone order to discontinue Wound Vac was given by Dr. Clearance Coots. Vac was applied on early a.m. On 09/26/2012.

## 2012-09-28 NOTE — Discharge Summary (Signed)
  Obstetric Discharge Summary Reason for Admission: observation/evaluation Prenatal Procedures: none Intrapartum Procedures: cesarean: classical Postpartum Procedures: none Complications-Operative and Postpartum: none  Hemoglobin  Date Value Range Status  09/26/2012 8.9* 12.0 - 15.0 g/dL Final     HCT  Date Value Range Status  09/26/2012 27.4* 36.0 - 46.0 % Final    Physical Exam:  General: alert Lochia: appropriate Uterine: firm Incision: wound vac dressing in place DVT Evaluation: No evidence of DVT seen on physical exam.  Discharge Diagnoses: Active Problems:   Previous cesarean delivery affecting pregnancy, antepartum   Triplet pregnancy in second trimester   Chronic hypertension in pregnancy   Discharge Information: Date: 09/28/2012 Activity: pelvic rest Diet: routine Medications:  Prior to Admission medications   Medication Sig Start Date End Date Taking? Authorizing Provider  Prenatal Vit-Fe Fumarate-FA (PRENATAL MULTIVITAMIN) TABS Take 1 tablet by mouth daily at 12 noon.   Yes Historical Provider, MD  labetalol (NORMODYNE) 200 MG tablet Take 1 tablet (200 mg total) by mouth every 8 (eight) hours. 09/28/12   Antionette Char, MD  oxyCODONE-acetaminophen (PERCOCET) 5-325 MG per tablet Take 2 tablets by mouth every 6 (six) hours as needed for pain. 09/28/12   Antionette Char, MD    Condition: stable Instructions: refer to routine discharge instructions Discharge to: home Follow-up Information   Follow up with Antionette Char A, MD. Schedule an appointment as soon as possible for a visit in 3 days.   Contact information:   [address] [place] [address] [phone]       Newborn Data: Live born  Information for the patient's newborn:  Sharona, Rovner [[id number]]  female Information for the patient's newborn:  Avital, Dancy [[id number]]  female Information for the patient's newborn:  Evon, Dejarnett [[id number]]   female ; APGAR (1 MIN):    Jayonna, Meyering [[id number]]  8   Alan, Riles [place] [[id number]]  8   Josceline, Chenard [place] [[id number]]  7   APGAR (5 MINS):    Mariyah, Upshaw [[id number]]  [address] [place] [[id number]]  2   Treesa, Mccully [facility] [[id number]]  9   APGAR (10 MINS):    Rieser, Girl A Preslee [[id number]]    Kourtni, Stineman [place] [[id number]]  [address] [place] [facility] [[id number]]     JACKSON-MOORE,Arlet Marter A 09/28/2012, 9:40 AM

## 2012-09-28 NOTE — Progress Notes (Signed)
Pt discharged home with friend... Condition stable... No equipment... Ambulated to car with P. Cates, NT.

## 2012-09-28 NOTE — Progress Notes (Signed)
Ur chart review completed.  

## 2012-09-29 LAB — TYPE AND SCREEN
ABO/RH(D): O POS
Antibody Screen: NEGATIVE
Unit division: 0

## 2012-09-30 ENCOUNTER — Ambulatory Visit (HOSPITAL_COMMUNITY): Payer: Medicaid Other

## 2012-10-01 ENCOUNTER — Ambulatory Visit (INDEPENDENT_AMBULATORY_CARE_PROVIDER_SITE_OTHER): Payer: Medicaid Other | Admitting: Obstetrics

## 2012-10-01 VITALS — BP 140/84 | HR 80 | Temp 97.7°F | Wt 346.2 lb

## 2012-10-01 DIAGNOSIS — Z3483 Encounter for supervision of other normal pregnancy, third trimester: Secondary | ICD-10-CM

## 2012-10-01 NOTE — Progress Notes (Signed)
Subjective:     Jenny Mann is a 32 y.o. female who presents for a postpartum visit/ incision check. She is 6 days postpartum following a low cervical vertical Cesarean section. I have fully reviewed the prenatal and intrapartum course. The delivery was at 34 gestational weeks. Outcome: Repeat Classical Cesarean Section.  Viable triplets.  No complications. Anesthesia: spinal. Postpartum course has been normal. Baby's course has been NICU. Baby is feeding by both breast and bottle - not known. Bleeding moderate lochia. Bowel function is normal. Bladder function is normal. Patient is not sexually active. Contraception method is abstinence. Postpartum depression screening: negative.  The following portions of the patient's history were reviewed and updated as appropriate: allergies, current medications, past family history, past medical history, past social history, past surgical history and problem list.  Review of Systems Pertinent items are noted in HPI.   Objective:    BP 140/84  Pulse 80  Temp(Src) 97.7 F (36.5 C) (Oral)  Wt 346 lb 3.2 oz (157.035 kg)  BMI 61.34 kg/m2  LMP 01/02/2012  Breastfeeding? Yes  General:  alert and no distress   Breasts:  inspection negative, no nipple discharge or bleeding, no masses or nodularity palpable  Lungs: clear to auscultation bilaterally  Heart:  regular rate and rhythm, S1, S2 normal, no murmur, click, rub or gallop  Abdomen: normal findings: soft, non-tender.  Incision C, D, I.   Vulva:  not evaluated  Vagina: not evaluated  Cervix:  not examined  Corpus: not examined  Adnexa:  not evaluated  Rectal Exam: Not performed.        Assessment:     Normal postpartum exam. Pap smear not done at today's visit.   Plan:    1. Contraception: none 2. Wants Nexplanon 3. Follow up in: 1 week or as needed.

## 2012-10-02 ENCOUNTER — Encounter: Payer: Self-pay | Admitting: Obstetrics

## 2012-10-05 ENCOUNTER — Ambulatory Visit (INDEPENDENT_AMBULATORY_CARE_PROVIDER_SITE_OTHER): Payer: Medicaid Other | Admitting: Obstetrics & Gynecology

## 2012-10-05 ENCOUNTER — Encounter: Payer: Self-pay | Admitting: Obstetrics & Gynecology

## 2012-10-05 NOTE — Progress Notes (Signed)
Subjective:     Jenny Mann is a 32 y.o. female who presents for a postpartum visit. She is 1 week postpartum following a low cervical transverse Cesarean section. I have fully reviewed the prenatal and intrapartum course. The delivery was at 34 gestational weeks. Outcome: repeat cesarean section, low transverse incision. Anesthesia: spinal. Postpartum course has been WNL. Baby's course has been complicated by a NICU stay. Two out of three babies are at home at this time. Baby is feeding by both breast and bottle - brand unknown. Bleeding moderate lochia. Bowel function is normal. Bladder function is normal. Patient is not sexually active. Contraception method is abstinence. Postpartum depression screening: negative.  The following portions of the patient's history were reviewed and updated as appropriate: allergies, current medications, past family history, past medical history, past social history, past surgical history and problem list. Second BP check after patient took BP medication 159/99 P 80. Review of Systems Pertinent items are noted in HPI.   Objective:    BP 169/107  Pulse 99  Temp(Src) 98.6 F (37 C)  Ht 5\' 3"  (1.6 m)  Wt 316 lb (143.337 kg)  BMI 55.99 kg/m2  LMP 01/02/2012       Abd: incision healing well Assessment:     Normal postpartum exam.   Plan:     Contraception: plans Nexplanon  Follow up in: 1 week or as needed.

## 2012-10-07 ENCOUNTER — Ambulatory Visit (HOSPITAL_COMMUNITY): Payer: Medicaid Other

## 2012-10-09 ENCOUNTER — Other Ambulatory Visit (HOSPITAL_COMMUNITY): Payer: Medicaid Other

## 2012-10-12 ENCOUNTER — Encounter: Payer: Self-pay | Admitting: Obstetrics & Gynecology

## 2012-10-12 ENCOUNTER — Ambulatory Visit (INDEPENDENT_AMBULATORY_CARE_PROVIDER_SITE_OTHER): Payer: Medicaid Other | Admitting: Obstetrics & Gynecology

## 2012-10-12 NOTE — Patient Instructions (Signed)

## 2012-10-12 NOTE — Progress Notes (Signed)
Subjective:     Jenny Mann is a 32 y.o. female here for a routine exam.  Current complaints: incision check. Pt delivered triplets via C-section on September 25, 2012. Pt sttes her incision has healed. Pt is interested in the Nexplanon for contraception.  Personal health questionnaire reviewed: yes.   Gynecologic History No LMP recorded. Contraception: abstinence   Obstetric History OB History   Grav Para Term Preterm Abortions TAB SAB Ect Mult Living   5 4 3 1 1 1  0 0 1 4     # Outc Date GA Lbr Len/2nd Wgt Sex Del Anes PTL Lv   1 TRM 2/03 [redacted]w[redacted]d  7lb14oz(3.572kg) F LTCS   Yes   Comments: preeclampsia   2 TRM 6/07 [redacted]w[redacted]d  7lb15oz(3.6kg) F LTCS   Yes   3 TRM 6/08    M LTCS  No No   4A PRE 7/14 [redacted]w[redacted]d 00:00 3lb13oz(1.729kg) F CCS Spinal  Yes   4B  7/14 [redacted]w[redacted]d 00:00 4lb11.3oz(2.135kg) M CCS Spinal     4C  7/14 [redacted]w[redacted]d 00:00 4lb0.4oz(1.826kg) M CCS Spinal     5 TAB                The following portions of the patient's history were reviewed and updated as appropriate: allergies, current medications, past family history, past medical history, past social history and past surgical history.  Review of Systems Pertinent items are noted in HPI.    Objective:      General:  alert     Abdomen: soft, non-tender; bowel sounds normal; no masses,  no organomegaly; I:C/D/I      Assessment:   Doing well, I: C/D/I  Plan:   Return in 1 week for a Nexplanon insertion

## 2012-10-19 ENCOUNTER — Encounter: Payer: Self-pay | Admitting: Obstetrics

## 2012-10-19 ENCOUNTER — Encounter: Payer: Self-pay | Admitting: *Deleted

## 2012-10-19 ENCOUNTER — Ambulatory Visit (INDEPENDENT_AMBULATORY_CARE_PROVIDER_SITE_OTHER): Payer: Medicaid Other | Admitting: Obstetrics

## 2012-10-19 VITALS — BP 119/76 | HR 79 | Temp 97.8°F | Ht 63.0 in | Wt 296.0 lb

## 2012-10-19 DIAGNOSIS — Z30017 Encounter for initial prescription of implantable subdermal contraceptive: Secondary | ICD-10-CM

## 2012-10-19 DIAGNOSIS — IMO0001 Reserved for inherently not codable concepts without codable children: Secondary | ICD-10-CM

## 2012-10-19 DIAGNOSIS — Z3009 Encounter for other general counseling and advice on contraception: Secondary | ICD-10-CM

## 2012-10-19 DIAGNOSIS — Z3202 Encounter for pregnancy test, result negative: Secondary | ICD-10-CM

## 2012-10-19 LAB — POCT URINE PREGNANCY: Preg Test, Ur: NEGATIVE

## 2012-10-19 MED ORDER — ETONOGESTREL 68 MG ~~LOC~~ IMPL: 68.0000 mg | DRUG_IMPLANT | Freq: Once | SUBCUTANEOUS | Status: AC

## 2012-10-19 MED ORDER — PRENATAL MULTIVITAMIN CH: 1.0000 | ORAL_TABLET | Freq: Every day | ORAL | Status: DC

## 2012-10-19 NOTE — Progress Notes (Signed)
NEXPLANON INSERTION NOTE  Date of LMP:   Unknown-patient is breast feeding  Contraception used: *Nexplanon prior to pregnancy  Pregnancy test result:  Lab Results  Component Value Date   PREGTESTUR Negative 10/19/2012    Indications:  The patient desires contraception.  She understands risks, benefits, and alternatives to Implanon and would like to proceed.  Anesthesia:   Lidocaine 1% plain.  Procedure:  A time-out was performed confirming the procedure and the patient's allergy status.  The patient's non-dominant was identified as the left arm.  The protection cap was removed. While placing countertraction on the skin, the needle was inserted at a 30 degree angle.  The applicator was held horizontal to the skin; the skin was tented upward as the needle was introduced into the subdermal space.  While holding the applicator in place, the slider was unlocked. The Nexplanon was removed from the field.  The Nexplanon was palpated to ensure proper placement.  Complications: None  Instructions:  The patient was instructed to remove the dressing in 24 hours and that some bruising is to be expected.  She was advised to use over the counter analgesics as needed for any pain at the site.  She is to keep the area dry for 24 hours and to call if her hand or arm becomes cold, numb, or blue.  Return visit:  Return in 2 weeks

## 2012-11-04 ENCOUNTER — Ambulatory Visit: Payer: Medicaid Other | Admitting: Obstetrics

## 2012-11-17 ENCOUNTER — Ambulatory Visit: Payer: Medicaid Other | Admitting: Obstetrics

## 2013-07-28 ENCOUNTER — Emergency Department (INDEPENDENT_AMBULATORY_CARE_PROVIDER_SITE_OTHER)
Admission: EM | Admit: 2013-07-28 | Discharge: 2013-07-28 | Disposition: A | Discharge status: Nursing Home (Includes ICF) | Payer: Medicaid Other | Source: Home / Self Care | Attending: Family Medicine | Admitting: Family Medicine

## 2013-07-28 ENCOUNTER — Emergency Department (HOSPITAL_COMMUNITY): Payer: Medicaid Other

## 2013-07-28 ENCOUNTER — Emergency Department (HOSPITAL_COMMUNITY)
Admission: EM | Admit: 2013-07-28 | Discharge: 2013-07-28 | Disposition: A | Discharge status: Home / Self Care | Payer: Medicaid Other | Attending: Emergency Medicine | Admitting: Emergency Medicine

## 2013-07-28 ENCOUNTER — Encounter (HOSPITAL_COMMUNITY): Payer: Self-pay | Admitting: Emergency Medicine

## 2013-07-28 DIAGNOSIS — S199XXA Unspecified injury of neck, initial encounter: Secondary | ICD-10-CM

## 2013-07-28 DIAGNOSIS — S0990XA Unspecified injury of head, initial encounter: Secondary | ICD-10-CM | POA: Insufficient documentation

## 2013-07-28 DIAGNOSIS — R519 Headache, unspecified: Secondary | ICD-10-CM

## 2013-07-28 DIAGNOSIS — Z87891 Personal history of nicotine dependence: Secondary | ICD-10-CM | POA: Insufficient documentation

## 2013-07-28 DIAGNOSIS — Y92410 Unspecified street and highway as the place of occurrence of the external cause: Secondary | ICD-10-CM | POA: Insufficient documentation

## 2013-07-28 DIAGNOSIS — S0993XA Unspecified injury of face, initial encounter: Secondary | ICD-10-CM

## 2013-07-28 DIAGNOSIS — R51 Headache: Secondary | ICD-10-CM

## 2013-07-28 DIAGNOSIS — D649 Anemia, unspecified: Secondary | ICD-10-CM | POA: Insufficient documentation

## 2013-07-28 DIAGNOSIS — I10 Essential (primary) hypertension: Secondary | ICD-10-CM | POA: Insufficient documentation

## 2013-07-28 DIAGNOSIS — Y9389 Activity, other specified: Secondary | ICD-10-CM | POA: Insufficient documentation

## 2013-07-28 DIAGNOSIS — Z79899 Other long term (current) drug therapy: Secondary | ICD-10-CM | POA: Insufficient documentation

## 2013-07-28 MED ORDER — ACETAMINOPHEN 325 MG PO TABS
650.0000 mg | ORAL_TABLET | Freq: Once | ORAL | Status: AC
  Administered 2013-07-28: 650 mg via ORAL
  Filled 2013-07-28: qty 2
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
## 2013-07-28 NOTE — Discharge Instructions (Signed)
Take tylenol as needed for pain. Refer to attached documents for more information. Refer to attached documents for more information.

## 2013-07-29 NOTE — ED Provider Notes (Signed)
Medical screening examination/treatment/procedure(s) were performed by a resident physician or non-physician practitioner and as the supervising physician I was immediately available for consultation/collaboration.  Cate Oravec, MD    Olson Lucarelli S Aradia Estey, MD 07/29/13 0748 

## 2013-07-30 NOTE — ED Provider Notes (Signed)
Medical screening examination/treatment/procedure(s) were performed by non-physician practitioner and as supervising physician I was immediately available for consultation/collaboration.   EKG Interpretation None        William Altagracia Rone, MD 07/30/13 1554 

## 2013-08-09 ENCOUNTER — Ambulatory Visit: Payer: Medicaid Other | Admitting: Obstetrics & Gynecology

## 2014-01-03 ENCOUNTER — Encounter (HOSPITAL_COMMUNITY): Payer: Self-pay | Admitting: Emergency Medicine

## 2014-02-28 ENCOUNTER — Encounter: Payer: Self-pay | Admitting: *Deleted

## 2014-03-01 ENCOUNTER — Encounter: Payer: Self-pay | Admitting: Obstetrics & Gynecology

## 2014-08-06 ENCOUNTER — Emergency Department (HOSPITAL_COMMUNITY)
Admission: EM | Admit: 2014-08-06 | Discharge: 2014-08-06 | Disposition: A | Discharge status: Home / Self Care | Payer: Medicaid Other | Attending: Emergency Medicine | Admitting: Emergency Medicine

## 2014-08-06 ENCOUNTER — Encounter (HOSPITAL_COMMUNITY): Payer: Self-pay | Admitting: Emergency Medicine

## 2014-08-06 ENCOUNTER — Emergency Department (HOSPITAL_COMMUNITY): Payer: Medicaid Other

## 2014-08-06 DIAGNOSIS — Y929 Unspecified place or not applicable: Secondary | ICD-10-CM | POA: Insufficient documentation

## 2014-08-06 DIAGNOSIS — Z862 Personal history of diseases of the blood and blood-forming organs and certain disorders involving the immune mechanism: Secondary | ICD-10-CM | POA: Insufficient documentation

## 2014-08-06 DIAGNOSIS — I10 Essential (primary) hypertension: Secondary | ICD-10-CM | POA: Diagnosis not present

## 2014-08-06 DIAGNOSIS — Z87891 Personal history of nicotine dependence: Secondary | ICD-10-CM | POA: Diagnosis not present

## 2014-08-06 DIAGNOSIS — W010XXA Fall on same level from slipping, tripping and stumbling without subsequent striking against object, initial encounter: Secondary | ICD-10-CM | POA: Diagnosis not present

## 2014-08-06 DIAGNOSIS — Y9389 Activity, other specified: Secondary | ICD-10-CM | POA: Insufficient documentation

## 2014-08-06 DIAGNOSIS — Y998 Other external cause status: Secondary | ICD-10-CM | POA: Diagnosis not present

## 2014-08-06 DIAGNOSIS — S79912A Unspecified injury of left hip, initial encounter: Secondary | ICD-10-CM | POA: Diagnosis present

## 2014-08-06 DIAGNOSIS — M25562 Pain in left knee: Secondary | ICD-10-CM

## 2014-08-06 DIAGNOSIS — S8992XA Unspecified injury of left lower leg, initial encounter: Secondary | ICD-10-CM | POA: Insufficient documentation

## 2014-08-06 MED ORDER — IBUPROFEN 400 MG PO TABS
600.0000 mg | ORAL_TABLET | Freq: Once | ORAL | Status: AC
  Administered 2014-08-06: 600 mg via ORAL
  Filled 2014-08-06 (×2): qty 1

## 2014-08-06 MED ORDER — OXYCODONE-ACETAMINOPHEN 5-325 MG PO TABS
2.0000 | ORAL_TABLET | Freq: Once | ORAL | Status: AC
  Administered 2014-08-06: 2 via ORAL
  Filled 2014-08-06: qty 2

## 2014-08-06 MED ORDER — IBUPROFEN 600 MG PO TABS: 600.0000 mg | ORAL_TABLET | Freq: Three times a day (TID) | ORAL | Status: DC | PRN

## 2014-08-06 NOTE — ED Provider Notes (Signed)
CSN: [id number]     Arrival date & time 08/06/14  0331 History   None    This chart was scribed for Azalia Bilis, MD by Arlan Organ, ED Scribe. This patient was seen in room D30C/D30C and the patient's care was started 3:47 AM.   Chief Complaint  Patient presents with  . Hip Pain   The history is provided by the patient. No language interpreter was used.    HPI Comments: Jenny Mann is a 34 y.o. female with a PMHx of HTN who presents to the Emergency Department complaining of constant, ongoing, unchanged L hip pain that radiates down to the L knee onset 2-3 hours prior to arrival. Pt states she was attempting to pick something up when she turned incorrectly, tripped, and fell to the ground landing on her knees. No head trauma or LOC at time of fall. Pain is exacerbated with certain movements and palpation. No alleviating factors at this time. No OTC medications or home remedies attempted prior to arrival. No numbness, loss of sensation, or weakness. No known allergies to medications.  Past Medical History  Diagnosis Date  . Abnormal Pap smear   . Hypertension   . PIH (pregnancy induced hypertension)     ?2nd and 3rd pregnancy  . Anemia September 15 2012    office called her and told her to take Fe   Past Surgical History  Procedure Laterality Date  . Cesarean section    . Cesarean section N/A 09/25/2012    Procedure: REPEAT  CESAREAN SECTION/TRIPLETS;  Surgeon: Antionette Char, MD;  Location: [facility];  Service: Obstetrics;  Laterality: N/A;   Family History  Problem Relation Age of Onset  . Diabetes Maternal Grandmother   . Diabetes Paternal Grandmother   . Kidney disease Paternal Grandmother    History  Substance Use Topics  . Smoking status: Former Games developer  . Smokeless tobacco: Never Used  . Alcohol Use: No   OB History    Gravida Para Term Preterm AB TAB SAB Ectopic Multiple Living   0 0 1 4     Review of Systems  A complete 10 system review of systems  was obtained and all systems are negative except as noted in the HPI and PMH.    Allergies  Shellfish allergy  Home Medications   Prior to Admission medications   Medication Sig Start Date End Date Taking? Authorizing Provider  etonogestrel (IMPLANON) 68 MG IMPL implant Inject 1 each into the skin once.    Historical Provider, MD   Triage Vitals: BP 134/66 mmHg  Pulse 80  Temp(Src) 97.5 F (36.4 C) (Oral)  Resp 18  Ht  (1.6 m)  Wt 318 lb (144.244 kg)  BMI 56.35 kg/m2  SpO2 98%  LMP 07/14/2014 (Approximate)   Physical Exam  Constitutional: She is oriented to person, place, and time. She appears well-developed and well-nourished.  HENT:  Head: Normocephalic.  Eyes: EOM are normal.  Neck: Normal range of motion.  Cardiovascular:  Normal pulses to the L foot  Pulmonary/Chest: Effort normal.  Abdominal: She exhibits no distension.  Musculoskeletal: Normal range of motion.  Proximal anterior tibia with medial and lateral joint line tenderness Able to range L knee but with pain  Neurological: She is alert and oriented to person, place, and time.  Psychiatric: She has a normal mood and affect.  Nursing note and vitals reviewed.   ED Course  Procedures (including critical care time)  DIAGNOSTIC STUDIES: Oxygen Saturation is 98% on RA, Normal by my interpretation.    COORDINATION OF CARE: 3:50 AM- Will give Percocet and Motrin. Will order DG knee complete 4 views L. Discussed treatment plan with pt at bedside and pt agreed to plan.     Labs Review Labs Reviewed - No data to display  Imaging Review Dg Knee Complete 4 Views Left  08/06/2014   CLINICAL DATA:  Acute onset of left lateral hip pain, radiating to the left knee. Status post fall. Initial encounter.  EXAM: LEFT KNEE - COMPLETE 4+ VIEW  COMPARISON:  Left knee radiographs performed 12/10/2005  FINDINGS: There is no evidence of fracture or dislocation. The joint spaces are preserved. No significant degenerative  change is seen; the patellofemoral joint is grossly unremarkable in appearance.  No significant joint effusion is seen. The visualized soft tissues are normal in appearance.  IMPRESSION: No evidence of fracture or dislocation.   Electronically Signed   By: Roanna RaiderJeffery  Chang M.D.   On: 08/06/2014 04:27  I personally reviewed the imaging tests through PACS system I reviewed available ER/hospitalization records through the EMR    EKG Interpretation None      MDM   Final diagnoses:  Left knee pain   Contusion without fracture.  X-ray normal.  Discharge home in good condition with anti-inflammatories.  Primary care follow-up.  No other injury.  I personally performed the services described in this documentation, which was scribed in my presence. The recorded information has been reviewed and is accurate.      Azalia BilisKevin Unice Vantassel, MD 08/06/14 [phone]

## 2014-08-06 NOTE — ED Notes (Signed)
Pt arrives c/o L hip pain radiating down to knees ongoing since a fall this morning.

## 2014-09-22 ENCOUNTER — Emergency Department (HOSPITAL_COMMUNITY)
Admission: EM | Admit: 2014-09-22 | Discharge: 2014-09-22 | Disposition: A | Discharge status: Home / Self Care | Payer: No Typology Code available for payment source | Attending: Emergency Medicine | Admitting: Emergency Medicine

## 2014-09-22 ENCOUNTER — Encounter (HOSPITAL_COMMUNITY): Payer: Self-pay | Admitting: Emergency Medicine

## 2014-09-22 DIAGNOSIS — M25551 Pain in right hip: Secondary | ICD-10-CM

## 2014-09-22 DIAGNOSIS — S79911A Unspecified injury of right hip, initial encounter: Secondary | ICD-10-CM | POA: Insufficient documentation

## 2014-09-22 DIAGNOSIS — M542 Cervicalgia: Secondary | ICD-10-CM

## 2014-09-22 DIAGNOSIS — Z87891 Personal history of nicotine dependence: Secondary | ICD-10-CM | POA: Insufficient documentation

## 2014-09-22 DIAGNOSIS — M62830 Muscle spasm of back: Secondary | ICD-10-CM | POA: Insufficient documentation

## 2014-09-22 DIAGNOSIS — Z862 Personal history of diseases of the blood and blood-forming organs and certain disorders involving the immune mechanism: Secondary | ICD-10-CM | POA: Insufficient documentation

## 2014-09-22 DIAGNOSIS — I10 Essential (primary) hypertension: Secondary | ICD-10-CM | POA: Insufficient documentation

## 2014-09-22 DIAGNOSIS — S199XXA Unspecified injury of neck, initial encounter: Secondary | ICD-10-CM | POA: Insufficient documentation

## 2014-09-22 DIAGNOSIS — Y998 Other external cause status: Secondary | ICD-10-CM | POA: Insufficient documentation

## 2014-09-22 DIAGNOSIS — Y92410 Unspecified street and highway as the place of occurrence of the external cause: Secondary | ICD-10-CM | POA: Insufficient documentation

## 2014-09-22 DIAGNOSIS — Y9389 Activity, other specified: Secondary | ICD-10-CM | POA: Insufficient documentation

## 2014-09-22 MED ORDER — OXYCODONE-ACETAMINOPHEN 5-325 MG PO TABS
1.0000 | ORAL_TABLET | ORAL | Status: DC | PRN
Start: 2014-09-22 — End: 2014-11-21

## 2014-09-22 MED ORDER — METHOCARBAMOL 500 MG PO TABS: 500.0000 mg | ORAL_TABLET | Freq: Two times a day (BID) | ORAL | Status: DC
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
## 2014-09-22 NOTE — Discharge Instructions (Signed)
Take the prescribed medication as directed.  Use caution when taking percocet, it can make you drowsy.   May wish to use heat therapy to help with muscle soreness/stiffness as well. Return to the ED for new or worsening symptoms.

## 2014-11-21 ENCOUNTER — Encounter (HOSPITAL_COMMUNITY): Payer: Self-pay

## 2014-11-21 ENCOUNTER — Emergency Department (INDEPENDENT_AMBULATORY_CARE_PROVIDER_SITE_OTHER)
Admission: EM | Admit: 2014-11-21 | Discharge: 2014-11-21 | Disposition: A | Discharge status: Home / Self Care | Payer: Self-pay | Source: Home / Self Care | Attending: Family Medicine | Admitting: Family Medicine

## 2014-11-21 ENCOUNTER — Emergency Department (INDEPENDENT_AMBULATORY_CARE_PROVIDER_SITE_OTHER): Payer: Self-pay

## 2014-11-21 DIAGNOSIS — S9032XA Contusion of left foot, initial encounter: Secondary | ICD-10-CM

## 2014-11-21 MED ORDER — DICLOFENAC POTASSIUM 50 MG PO TABS: 50.0000 mg | ORAL_TABLET | Freq: Three times a day (TID) | ORAL | Status: DC

## 2014-11-21 NOTE — Discharge Instructions (Signed)

## 2014-11-21 NOTE — ED Provider Notes (Signed)
CSN: [id number]     Arrival date & time 11/21/14  1448 History   First MD Initiated Contact with Patient 11/21/14 1630     Chief Complaint  Patient presents with  . Foot Injury   (Consider location/radiation/quality/duration/timing/severity/associated sxs/prior Treatment) HPI Comments: 34 year old female was assisting and taking a door off the hinges when the door accidentally fell and dropped home to her right foot. She is complaining of pain to the right mid foot dorsal aspect. She describes it as a throbbing headache. This occurred at her home approximately 12:30 today. Denies other injuries.   Past Medical History  Diagnosis Date  . Abnormal Pap smear   . Hypertension   . PIH (pregnancy induced hypertension)     ?2nd and 3rd pregnancy  . Anemia September 15 2012    office called her and told her to take Fe   Past Surgical History  Procedure Laterality Date  . Cesarean section    . Cesarean section N/A 09/25/2012    Procedure: REPEAT  CESAREAN SECTION/TRIPLETS;  Surgeon: Antionette Char, MD;  Location: [facility];  Service: Obstetrics;  Laterality: N/A;   Family History  Problem Relation Age of Onset  . Diabetes Maternal Grandmother   . Diabetes Paternal Grandmother   . Kidney disease Paternal Grandmother    Social History  Substance Use Topics  . Smoking status: Former Games developer  . Smokeless tobacco: Never Used  . Alcohol Use: No   OB History    Gravida Para Term Preterm AB TAB SAB Ectopic Multiple Living   0 0 1 4     Review of Systems  Constitutional: Negative for fever, chills and activity change.  Respiratory: Negative.   Musculoskeletal:       As per HPI  Skin: Negative for color change, pallor and rash.  Neurological: Negative.     Allergies  Shellfish allergy  Home Medications   Prior to Admission medications   Medication Sig Start Date End Date Taking? Authorizing Provider  diclofenac (CATAFLAM) 50 MG tablet Take 1 tablet (50 mg total) by mouth  3 (three) times daily. One tablet TID with food prn pain. 11/21/14   Hayden Rasmussen, NP  etonogestrel (IMPLANON) 68 MG IMPL implant Inject 1 each into the skin once.    Historical Provider, MD  Multiple Vitamins-Minerals (ONE-A-DAY WOMENS VITACRAVES PO) Take 1 tablet by mouth daily.    Historical Provider, MD   Meds Ordered and Administered this Visit  Medications - No data to display  BP 126/72 mmHg  Pulse 54  Temp(Src) 97.9 F (36.6 C) (Oral)  Resp 16  SpO2 97% No data found.   Physical Exam  Constitutional: She is oriented to person, place, and time. She appears well-developed and well-nourished. No distress.  HENT:  Head: Normocephalic and atraumatic.  Eyes: EOM are normal.  Pulmonary/Chest: Effort normal. No respiratory distress.  Musculoskeletal: Normal range of motion. She exhibits tenderness.  Minor swelling to the mid dorsum of the right foot. Positive for tenderness. Distal neurovascular motor sensory is  intact. Patient can wiggle her toes. Capillary refill less then 3 seconds. Minor swelling just distal to the lateral malleolus. No bony tenderness to the ankle.  Neurological: She is alert and oriented to person, place, and time. No cranial nerve deficit.  Skin: Skin is warm and dry.  Nursing note and vitals reviewed.   ED Course  Procedures (including critical care time)  Labs Review Labs Reviewed - No data to display  Imaging Review Dg Foot Complete Right  11/21/2014   CLINICAL DATA:  Crush injury to the right foot today. Pain across the top of the foot.  EXAM: RIGHT FOOT COMPLETE - 3+ VIEW  COMPARISON:  12/01/2010  FINDINGS: Mild degenerative changes at the first MTP joint. Negative for an acute fracture or dislocation. Stable alignment of the foot. Again noted is a mild hallux valgus deformity.  IMPRESSION: No acute abnormality.   Electronically Signed   By: Richarda Overlie M.D.   On: 11/21/2014 16:55     Visual Acuity Review  Right Eye Distance:   Left Eye Distance:    Bilateral Distance:    Right Eye Near:   Left Eye Near:    Bilateral Near:         MDM   1. Contusion, foot, left, initial encounter    RICE Coban wrap to foot Wt bearing as tolerated   Hayden Rasmussen, NP 11/21/14 1710

## 2014-11-21 NOTE — ED Notes (Signed)
Dropped a door on her right foot earlier today

## 2015-07-08 ENCOUNTER — Emergency Department (HOSPITAL_COMMUNITY)
Admission: EM | Admit: 2015-07-08 | Discharge: 2015-07-09 | Disposition: A | Discharge status: Home / Self Care | Payer: Medicaid Other | Attending: Emergency Medicine | Admitting: Emergency Medicine

## 2015-07-08 ENCOUNTER — Encounter (HOSPITAL_COMMUNITY): Payer: Self-pay | Admitting: Emergency Medicine

## 2015-07-08 ENCOUNTER — Emergency Department (HOSPITAL_COMMUNITY): Payer: Medicaid Other

## 2015-07-08 DIAGNOSIS — Z862 Personal history of diseases of the blood and blood-forming organs and certain disorders involving the immune mechanism: Secondary | ICD-10-CM | POA: Insufficient documentation

## 2015-07-08 DIAGNOSIS — R000 Tachycardia, unspecified: Secondary | ICD-10-CM | POA: Insufficient documentation

## 2015-07-08 DIAGNOSIS — I10 Essential (primary) hypertension: Secondary | ICD-10-CM | POA: Insufficient documentation

## 2015-07-08 DIAGNOSIS — Z87891 Personal history of nicotine dependence: Secondary | ICD-10-CM | POA: Insufficient documentation

## 2015-07-08 DIAGNOSIS — B9789 Other viral agents as the cause of diseases classified elsewhere: Secondary | ICD-10-CM

## 2015-07-08 DIAGNOSIS — J069 Acute upper respiratory infection, unspecified: Secondary | ICD-10-CM | POA: Insufficient documentation

## 2015-07-08 DIAGNOSIS — R062 Wheezing: Secondary | ICD-10-CM

## 2015-07-08 DIAGNOSIS — J45901 Unspecified asthma with (acute) exacerbation: Secondary | ICD-10-CM | POA: Insufficient documentation

## 2015-07-08 LAB — BASIC METABOLIC PANEL
Anion gap: 9 (ref 5–15)
BUN: <5 mg/dL — ABNORMAL LOW (ref 6–20)
CALCIUM: 9.3 mg/dL (ref 8.9–10.3)
CO2: 22 mmol/L (ref 22–32)
CREATININE: 0.88 mg/dL (ref 0.44–1.00)
Chloride: 107 mmol/L (ref 101–111)
GFR calc non Af Amer: >60 mL/min (ref >60)
Glucose, Bld: 101 mg/dL — ABNORMAL HIGH (ref 65–99)
Potassium: 3.6 mmol/L (ref 3.5–5.1)
SODIUM: 138 mmol/L (ref 135–145)

## 2015-07-08 LAB — CBC
HCT: 38.5 % (ref 36.0–46.0)
Hemoglobin: 12.9 g/dL (ref 12.0–15.0)
MCH: 26.2 pg (ref 26.0–34.0)
MCHC: 33.5 g/dL (ref 30.0–36.0)
MCV: 78.1 fL (ref 78.0–100.0)
PLATELETS: 241 10*3/uL (ref 150–400)
RBC: 4.93 MIL/uL (ref 3.87–5.11)
RDW: 13.9 % (ref 11.5–15.5)
WBC: 7.6 10*3/uL (ref 4.0–10.5)

## 2015-07-08 LAB — I-STAT TROPONIN, ED: TROPONIN I, POC: 0 ng/mL (ref 0.00–0.08)

## 2015-07-08 MED ORDER — BENZONATATE 100 MG PO CAPS: 100.0000 mg | ORAL_CAPSULE | Freq: Three times a day (TID) | ORAL | Status: DC

## 2015-07-08 MED ORDER — IPRATROPIUM BROMIDE 0.02 % IN SOLN
0.5000 mg | Freq: Once | RESPIRATORY_TRACT | Status: AC
  Administered 2015-07-08: 0.5 mg via RESPIRATORY_TRACT
  Filled 2015-07-08: qty 2.5

## 2015-07-08 MED ORDER — AEROCHAMBER Z-STAT PLUS/MEDIUM MISC
1.0000 | Freq: Once | Status: AC
  Administered 2015-07-09: 1
  Filled 2015-07-08 (×2): qty 1

## 2015-07-08 MED ORDER — FLUTICASONE PROPIONATE 50 MCG/ACT NA SUSP: 2.0000 | Freq: Every day | NASAL | Status: DC

## 2015-07-08 MED ORDER — CETIRIZINE HCL 10 MG PO TABS: 10.0000 mg | ORAL_TABLET | Freq: Every day | ORAL | Status: DC

## 2015-07-08 MED ORDER — PREDNISONE 20 MG PO TABS
60.0000 mg | ORAL_TABLET | Freq: Once | ORAL | Status: AC
  Administered 2015-07-08: 60 mg via ORAL
  Filled 2015-07-08: qty 3

## 2015-07-08 MED ORDER — IBUPROFEN 800 MG PO TABS
800.0000 mg | ORAL_TABLET | Freq: Once | ORAL | Status: AC
  Administered 2015-07-09: 800 mg via ORAL
  Filled 2015-07-08: qty 1

## 2015-07-08 MED ORDER — PREDNISONE 20 MG PO TABS: 40.0000 mg | ORAL_TABLET | Freq: Every day | ORAL | Status: DC

## 2015-07-08 MED ORDER — ALBUTEROL SULFATE HFA 108 (90 BASE) MCG/ACT IN AERS
2.0000 | INHALATION_SPRAY | RESPIRATORY_TRACT | Status: DC | PRN
  Administered 2015-07-09: 2 via RESPIRATORY_TRACT
  Filled 2015-07-08: qty 6.7

## 2015-07-08 MED ORDER — ALBUTEROL SULFATE (2.5 MG/3ML) 0.083% IN NEBU
5.0000 mg | INHALATION_SOLUTION | Freq: Once | RESPIRATORY_TRACT | Status: AC
  Administered 2015-07-08: 5 mg via RESPIRATORY_TRACT
  Filled 2015-07-08: qty 6

## 2015-07-08 NOTE — Discharge Instructions (Signed)
1. Medications: albuterol, prednisone, tessalon, flonase, usual home medications 2. Treatment: rest, drink plenty of fluids, begin OTC antihistamine (Zyrtec or Claritin)  3. Follow Up: Please followup with your primary doctor in 2-3 days for discussion of your diagnoses and further evaluation after today's visit; if you do not have a primary care doctor use the resource guide provided to find one; Please return to the ER for difficulty breathing, high fevers or worsening symptoms.    Asthma, Acute Bronchospasm Acute bronchospasm caused by asthma is also referred to as an asthma attack. Bronchospasm means your air passages become narrowed. The narrowing is caused by inflammation and tightening of the muscles in the air tubes (bronchi) in your lungs. This can make it hard to breathe or cause you to wheeze and cough. CAUSES Possible triggers are:  Animal dander from the skin, hair, or feathers of animals.  Dust mites contained in house dust.  Cockroaches.  Pollen from trees or grass.  Mold.  Cigarette or tobacco smoke.  Air pollutants such as dust, household cleaners, hair sprays, aerosol sprays, paint fumes, strong chemicals, or strong odors.  Cold air or weather changes. Cold air may trigger inflammation. Winds increase molds and pollens in the air.  Strong emotions such as crying or laughing hard.  Stress.  Certain medicines such as aspirin or beta-blockers.  Sulfites in foods and drinks, such as dried fruits and wine.  Infections or inflammatory conditions, such as a flu, cold, or inflammation of the nasal membranes (rhinitis).  Gastroesophageal reflux disease (GERD). GERD is a condition where stomach acid backs up into your esophagus.  Exercise or strenuous activity. SIGNS AND SYMPTOMS   Wheezing.  Excessive coughing, particularly at night.  Chest tightness.  Shortness of breath. DIAGNOSIS  Your health care provider will ask you about your medical history and  perform a physical exam. A chest X-ray or blood testing may be performed to look for other causes of your symptoms or other conditions that may have triggered your asthma attack. TREATMENT  Treatment is aimed at reducing inflammation and opening up the airways in your lungs. Most asthma attacks are treated with inhaled medicines. These include quick relief or rescue medicines (such as bronchodilators) and controller medicines (such as inhaled corticosteroids). These medicines are sometimes given through an inhaler or a nebulizer. Systemic steroid medicine taken by mouth or given through an IV tube also can be used to reduce the inflammation when an attack is moderate or severe. Antibiotic medicines are only used if a bacterial infection is present.  HOME CARE INSTRUCTIONS   Rest.  Drink plenty of liquids. This helps the mucus to remain thin and be easily coughed up. Only use caffeine in moderation and do not use alcohol until you have recovered from your illness.  Do not smoke. Avoid being exposed to secondhand smoke.  You play a critical role in keeping yourself in good health. Avoid exposure to things that cause you to wheeze or to have breathing problems.  Keep your medicines up-to-date and available. Carefully follow your health care provider's treatment plan.  Take your medicine exactly as prescribed.  When pollen or pollution is bad, keep windows closed and use an air conditioner or go to places with air conditioning.  Asthma requires careful medical care. See your health care provider for a follow-up as advised. If you are more than [redacted] weeks pregnant and you were prescribed any new medicines, let your obstetrician know about the visit and how you are doing. Follow  up with your health care provider as directed.  After you have recovered from your asthma attack, make an appointment with your outpatient doctor to talk about ways to reduce the likelihood of future attacks. If you do not  have a doctor who manages your asthma, make an appointment with a primary care doctor to discuss your asthma. SEEK IMMEDIATE MEDICAL CARE IF:   You are getting worse.  You have trouble breathing. If severe, call your local emergency services (911 in the [place]  You develop chest pain or discomfort.  You are vomiting.  You are not able to keep fluids down.  You are coughing up yellow, green, brown, or bloody sputum.  You have a fever and your symptoms suddenly get worse.  You have trouble swallowing. MAKE SURE YOU:   Understand these instructions.  Will watch your condition.  Will get help right away if you are not doing well or get worse.   This information is not intended to replace advice given to you by your health care provider. Make sure you discuss any questions you have with your health care provider.   Document Released: 06/05/2006 Document Revised: 02/23/2013 Document Reviewed: 08/26/2012 Elsevier Interactive Patient Education 2016 [employer]   Upper Respiratory Infection, Adult Most upper respiratory infections (URIs) are a viral infection of the air passages leading to the lungs. A URI affects the nose, throat, and upper air passages. The most common type of URI is nasopharyngitis and is typically referred to as "the common cold." URIs run their course and usually go away on their own. Most of the time, a URI does not require medical attention, but sometimes a bacterial infection in the upper airways can follow a viral infection. This is called a secondary infection. Sinus and middle ear infections are common types of secondary upper respiratory infections. Bacterial pneumonia can also complicate a URI. A URI can worsen asthma and chronic obstructive pulmonary disease (COPD). Sometimes, these complications can require emergency medical care and may be life threatening.  CAUSES Almost all URIs are caused by viruses. A virus is a type of germ and can spread from one  person to another.  RISKS FACTORS You may be at risk for a URI if:   You smoke.   You have chronic heart or lung disease.  You have a weakened defense (immune) system.   You are very young or very old.   You have nasal allergies or asthma.  You work in crowded or poorly ventilated areas.  You work in health care facilities or schools. SIGNS AND SYMPTOMS  Symptoms typically develop 2-3 days after you come in contact with a cold virus. Most viral URIs last 7-10 days. However, viral URIs from the influenza virus (flu virus) can last 14-18 days and are typically more severe. Symptoms may include:   Runny or stuffy (congested) nose.   Sneezing.   Cough.   Sore throat.   Headache.   Fatigue.   Fever.   Loss of appetite.   Pain in your forehead, behind your eyes, and over your cheekbones (sinus pain).  Muscle aches.  DIAGNOSIS  Your health care provider may diagnose a URI by:  Physical exam.  Tests to check that your symptoms are not due to another condition such as:  Strep throat.  Sinusitis.  Pneumonia.  Asthma. TREATMENT  A URI goes away on its own with time. It cannot be cured with medicines, but medicines may be prescribed or recommended to relieve symptoms. Medicines  may help:  Reduce your fever.  Reduce your cough.  Relieve nasal congestion. HOME CARE INSTRUCTIONS   Take medicines only as directed by your health care provider.   Gargle warm saltwater or take cough drops to comfort your throat as directed by your health care provider.  Use a warm mist humidifier or inhale steam from a shower to increase air moisture. This may make it easier to breathe.  Drink enough fluid to keep your urine clear or pale yellow.   Eat soups and other clear broths and maintain good nutrition.   Rest as needed.   Return to work when your temperature has returned to normal or as your health care provider advises. You may need to stay home longer  to avoid infecting others. You can also use a face mask and careful hand washing to prevent spread of the virus.  Increase the usage of your inhaler if you have asthma.   Do not use any tobacco products, including cigarettes, chewing tobacco, or electronic cigarettes. If you need help quitting, ask your health care provider. PREVENTION  The best way to protect yourself from getting a cold is to practice good hygiene.   Avoid oral or hand contact with people with cold symptoms.   Wash your hands often if contact occurs.  There is no clear evidence that vitamin C, vitamin E, echinacea, or exercise reduces the chance of developing a cold. However, it is always recommended to get plenty of rest, exercise, and practice good nutrition.  SEEK MEDICAL CARE IF:   You are getting worse rather than better.   Your symptoms are not controlled by medicine.   You have chills.  You have worsening shortness of breath.  You have brown or red mucus.  You have yellow or brown nasal discharge.  You have pain in your face, especially when you bend forward.  You have a fever.  You have swollen neck glands.  You have pain while swallowing.  You have white areas in the back of your throat. SEEK IMMEDIATE MEDICAL CARE IF:   You have severe or persistent:  Headache.  Ear pain.  Sinus pain.  Chest pain.  You have chronic lung disease and any of the following:  Wheezing.  Prolonged cough.  Coughing up blood.  A change in your usual mucus.  You have a stiff neck.  You have changes in your:  Vision.  Hearing.  Thinking.  Mood. MAKE SURE YOU:   Understand these instructions.  Will watch your condition.  Will get help right away if you are not doing well or get worse.   This information is not intended to replace advice given to you by your health care provider. Make sure you discuss any questions you have with your health care provider.   Document Released:  08/14/2000 Document Revised: 07/05/2014 Document Reviewed: 05/26/2013 Elsevier Interactive Patient Education [employer]

## 2015-07-08 NOTE — ED Notes (Signed)
Patient states she woke up this morning not feeling well, with chest pain, shortness of breath and cough, chills and hot flashes.  Patient states that the chest pain gets worse with her coughing.

## 2015-07-08 NOTE — ED Provider Notes (Signed)
CSN: [id number]     Arrival date & time 07/08/15  2040 History   First MD Initiated Contact with Patient 07/08/15 2131     Chief Complaint  Patient presents with  . Chest Pain  . Cough  . Generalized Body Aches     (Consider location/radiation/quality/duration/timing/severity/associated sxs/prior Treatment) The history is provided by the patient and medical records. No language interpreter was used.    Jenny Mann is a 35 y.o. female  with a hx of HTN, anemia, asthma (no inhaler) presents to the Emergency Department complaining of gradual, persistent, progressively worsening ST onset this morning. Associated symptoms include cough, SOB, rhinorrhea, fatigue, subjective fevers and chills all worsening throughout the day today.  Pt reports CP and bilateral rib pain with coughing and deep breathing.  Pt reports cough productive of green sputum and occasionally blood streaked.  Pt has taken Dayquil without relief.  Nothing makes it better or worse.  Pt denies headache, neck pain, neck stiffness, abd pain, N/V/D, weakness, dizziness, syncope, dysuria.  NO recent surgery, estrogen usage, immobilization, leg swelling, or hx of DVT.      Past Medical History  Diagnosis Date  . Abnormal Pap smear   . Hypertension   . PIH (pregnancy induced hypertension)     ?2nd and 3rd pregnancy  . Anemia September 15 2012    office called her and told her to take Fe   Past Surgical History  Procedure Laterality Date  . Cesarean section    . Cesarean section N/A 09/25/2012    Procedure: REPEAT  CESAREAN SECTION/TRIPLETS;  Surgeon: Antionette CharLisa Jackson-Moore, MD;  Location: [facility];  Service: Obstetrics;  Laterality: N/A;   Family History  Problem Relation Age of Onset  . Diabetes Maternal Grandmother   . Diabetes Paternal Grandmother   . Kidney disease Paternal Grandmother    Social History  Substance Use Topics  . Smoking status: Former Games developermoker  . Smokeless tobacco: Never Used  . Alcohol Use: No   OB History     Gravida Para Term Preterm AB TAB SAB Ectopic Multiple Living   5 4 3 1 1 1  0 0 1 4     Review of Systems  Constitutional: Positive for fever ( subjective), chills and fatigue. Negative for appetite change.  HENT: Positive for congestion, postnasal drip, rhinorrhea, sinus pressure and sore throat. Negative for ear discharge, ear pain and mouth sores.   Eyes: Negative for pain, redness, itching and visual disturbance.  Respiratory: Positive for cough, chest tightness, shortness of breath and wheezing. Negative for stridor.   Cardiovascular: Positive for chest pain. Negative for palpitations and leg swelling.  Gastrointestinal: Negative for nausea, vomiting, abdominal pain and diarrhea.  Genitourinary: Negative for dysuria, urgency, frequency and hematuria.  Musculoskeletal: Negative for myalgias, back pain, arthralgias and neck stiffness.  Skin: Negative for rash.  Neurological: Negative for syncope, light-headedness, numbness and headaches.  Hematological: Negative for adenopathy.  Psychiatric/Behavioral: The patient is not nervous/anxious.   All other systems reviewed and are negative.     Allergies  Shellfish allergy  Home Medications   Prior to Admission medications   Medication Sig Start Date End Date Taking? Authorizing Provider  etonogestrel (IMPLANON) 68 MG IMPL implant Inject 1 each into the skin once.   Yes Historical Provider, MD  Pseudoephedrine-APAP-DM (DAYQUIL PO) Take 15 mLs by mouth daily as needed. For flu and cold symptoms   Yes Historical Provider, MD  benzonatate (TESSALON) 100 MG capsule Take 1 capsule (100 mg total)  by mouth every 8 (eight) hours. 07/08/15   Zahmir Lalla, PA-C  cetirizine (ZYRTEC ALLERGY) 10 MG tablet Take 1 tablet (10 mg total) by mouth daily. 07/08/15   Damek Ende, PA-C  diclofenac (CATAFLAM) 50 MG tablet Take 1 tablet (50 mg total) by mouth 3 (three) times daily. One tablet TID with food prn pain. Patient not taking: Reported on  07/08/2015 11/21/14   Hayden Rasmussen, NP  fluticasone [facility]) 50 MCG/ACT nasal spray Place 2 sprays into both nostrils daily. 07/08/15   Toniann Dickerson, PA-C  predniSONE (DELTASONE) 20 MG tablet Take 2 tablets (40 mg total) by mouth daily. 07/08/15   Chinaza Rooke, PA-C   BP 123/84 mmHg  Pulse 102  Temp(Src) 99.1 F (37.3 C) (Oral)  Resp 18  Ht 5\' 3"  (1.6 m)  Wt 145.741 kg  BMI 56.93 kg/m2  SpO2 99% Physical Exam  Constitutional: She is oriented to person, place, and time. She appears well-developed and well-nourished. No distress.  HENT:  Head: Normocephalic and atraumatic.  Right Ear: Tympanic membrane, external ear and ear canal normal.  Left Ear: Tympanic membrane, external ear and ear canal normal.  Nose: Mucosal edema and rhinorrhea present. No epistaxis. Right sinus exhibits no maxillary sinus tenderness and no frontal sinus tenderness. Left sinus exhibits no maxillary sinus tenderness and no frontal sinus tenderness.  Mouth/Throat: Uvula is midline and mucous membranes are normal. Mucous membranes are not pale and not cyanotic. No oropharyngeal exudate, posterior oropharyngeal edema, posterior oropharyngeal erythema or tonsillar abscesses.  Eyes: Conjunctivae are normal. Pupils are equal, round, and reactive to light.  Neck: Normal range of motion and full passive range of motion without pain.  Cardiovascular: Normal heart sounds and intact distal pulses.  Tachycardia present.   Pulses:      Radial pulses are 2+ on the right side, and 2+ on the left side.       Dorsalis pedis pulses are 2+ on the right side, and 2+ on the left side.  Pulmonary/Chest: Effort normal. No accessory muscle usage or stridor. No tachypnea. No respiratory distress. She has decreased breath sounds ( Throughout). She has wheezes ( Throughout, expiratory). She has no rhonchi. She has no rales. She exhibits tenderness.  Bilateral rib tenderness  Abdominal: Soft. Bowel sounds are normal. There is no  tenderness.  Musculoskeletal: Normal range of motion.  No peripheral edema, no calf tenderness  Lymphadenopathy:    She has no cervical adenopathy.  Neurological: She is alert and oriented to person, place, and time.  Skin: Skin is warm and dry. No rash noted. She is not diaphoretic.  Psychiatric: She has a normal mood and affect.  Nursing note and vitals reviewed.   ED Course  Procedures (including critical care time) Labs Review Labs Reviewed  BASIC METABOLIC PANEL - Abnormal; Notable for the following:    Glucose, Bld 101 (*)    BUN <5 (*)    All other components within normal limits  CBC  I-STAT TROPOININ, ED    Imaging Review Dg Chest 2 View  07/08/2015  CLINICAL DATA:  Chest pain.  Cough. EXAM: CHEST  2 VIEW COMPARISON:  May 29, 2008 FINDINGS: The heart size and mediastinal contours are within normal limits. Both lungs are clear. The visualized skeletal structures are unremarkable. IMPRESSION: No active cardiopulmonary disease. Electronically Signed   By: Gerome Sam III M.D   On: 07/08/2015 21:18   I have personally reviewed and evaluated these images and lab results as part of my medical  decision-making.   EKG Interpretation   Date/Time:  Saturday Jul 08 2015 20:50:06 EDT Ventricular Rate:  107 PR Interval:  128 QRS Duration: 78 QT Interval:  300 QTC Calculation: 400 R Axis:   50 Text Interpretation:  Sinus tachycardia Right atrial enlargement  Borderline ECG Confirmed by Rubin Payor  MD, NATHAN [phone]) on 07/08/2015  11:58:48 PM      MDM   Final diagnoses:  Viral URI with cough  Wheezing  Asthma exacerbation   Uri Jenny Mann presents for URI symptoms. Chest pain is reproducible and only occurs with coughing. Likely musculoskeletal. Patient with tachycardia likely secondary to URI and shortness of breath due to wheezing. Highly doubt PE. Patient is low risk for same.  Patient history of asthma but is not using albuterol inhaler at home.  Pt CXR negative  for acute infiltrate. Patients symptoms are consistent with URI, likely viral etiology. Discussed that antibiotics are not indicated for viral infections. Pt will be discharged with symptomatic treatment.    11:55 PM Breathing treatment given with significant improvement in wheezing.  Patient ambulated in ED with O2 saturations maintained >90, no current signs of respiratory distress. Lung exam improved after nebulizer treatment. Prednisone given in the ED and pt will be dc with 5 day burst. Pt states they are breathing at baseline and she reports she is feeling much better. Pt has been instructed to continue using prescribed medications and to speak with PCP about today's exacerbation.      BP 122/66 mmHg  Pulse 100  Temp(Src) 99 F (37.2 C) (Oral)  Resp 18  Ht  (1.6 m)  Wt 145.741 kg  BMI 56.93 kg/m2  SpO2 99%   Dierdre Forth, PA-C 07/09/15 0108  Benjiman Core, MD 07/09/15 [phone]

## 2015-07-09 MED ORDER — AEROCHAMBER PLUS W/MASK MISC
1.0000 | Freq: Once | Status: AC
  Administered 2015-07-09: 1
  Filled 2015-07-09: qty 1

## 2015-07-09 NOTE — ED Notes (Signed)
Pt verbalized understanding of use of albuterol inhaler with aero chamber and has no further questions. Pt stable and NAD

## 2015-09-05 ENCOUNTER — Encounter (HOSPITAL_COMMUNITY): Payer: Self-pay | Admitting: Emergency Medicine

## 2015-09-05 ENCOUNTER — Ambulatory Visit (HOSPITAL_COMMUNITY)
Admission: EM | Admit: 2015-09-05 | Discharge: 2015-09-05 | Disposition: A | Discharge status: Home / Self Care | Payer: Self-pay | Attending: Emergency Medicine | Admitting: Emergency Medicine

## 2015-09-05 DIAGNOSIS — R109 Unspecified abdominal pain: Secondary | ICD-10-CM | POA: Insufficient documentation

## 2015-09-05 DIAGNOSIS — R110 Nausea: Secondary | ICD-10-CM | POA: Insufficient documentation

## 2015-09-05 DIAGNOSIS — Z87891 Personal history of nicotine dependence: Secondary | ICD-10-CM | POA: Insufficient documentation

## 2015-09-05 DIAGNOSIS — R197 Diarrhea, unspecified: Secondary | ICD-10-CM | POA: Insufficient documentation

## 2015-09-05 DIAGNOSIS — Z91013 Allergy to seafood: Secondary | ICD-10-CM | POA: Insufficient documentation

## 2015-09-05 LAB — C DIFFICILE QUICK SCREEN W PCR REFLEX
C Diff antigen: NEGATIVE
C Diff interpretation: NEGATIVE
C Diff toxin: NEGATIVE

## 2015-09-05 NOTE — Discharge Instructions (Signed)
We are going to send your stool for testing. We will call you with the results in 2-3 days. In the meantime, you can use over-the-counter Imodium to help slow the diarrhea. Do not completely stop the diarrhea. Stick with a mostly clear liquid diet for another day, then slowly add back solid food (starting with bland foods). If you develop fevers, severe abdominal pain, or see blood in the stool, please come back.

## 2015-09-05 NOTE — ED Provider Notes (Signed)
CSN: [id number]     Arrival date & time 09/05/15  1428 History   First MD Initiated Contact with Patient 09/05/15 1440     Chief Complaint  Patient presents with  . Abdominal Pain  . Nausea  . Diarrhea   (Consider location/radiation/quality/duration/timing/severity/associated sxs/prior Treatment) HPI  She is a 35 year old woman here for evaluation of diarrhea. Her symptoms started 3 days ago with upset stomach, nausea, and diarrhea. She denies any fevers or vomiting. She states the diarrhea is very watery. She continues to have a bowel movement every 30-60 minutes. She denies any blood in the stool. She is able to eat and drink.  She denies any pain in her abdomen, but states it feels unsettled.  She did eat at a cook out about 6 hours before her symptoms started. She has seen no improvement in her symptoms since they started.  No recent antibiotics.  Past Medical History  Diagnosis Date  . Abnormal Pap smear   . Hypertension   . PIH (pregnancy induced hypertension)     ?2nd and 3rd pregnancy  . Anemia September 15 2012    office called her and told her to take Fe   Past Surgical History  Procedure Laterality Date  . Cesarean section    . Cesarean section N/A 09/25/2012    Procedure: REPEAT  CESAREAN SECTION/TRIPLETS;  Surgeon: Antionette CharLisa Jackson-Moore, MD;  Location: [facility];  Service: Obstetrics;  Laterality: N/A;   Family History  Problem Relation Age of Onset  . Diabetes Maternal Grandmother   . Diabetes Paternal Grandmother   . Kidney disease Paternal Grandmother    Social History  Substance Use Topics  . Smoking status: Former Games developermoker  . Smokeless tobacco: Never Used  . Alcohol Use: No   OB History    Gravida Para Term Preterm AB TAB SAB Ectopic Multiple Living   5 4 3 1 1 1  0 0 1 4     Review of Systems As in history of present illness Allergies  Shellfish allergy  Home Medications   Prior to Admission medications   Medication Sig Start Date End Date Taking? Authorizing  Provider  benzonatate (TESSALON) 100 MG capsule Take 1 capsule (100 mg total) by mouth every 8 (eight) hours. 07/08/15   Hannah Muthersbaugh, PA-C  cetirizine (ZYRTEC ALLERGY) 10 MG tablet Take 1 tablet (10 mg total) by mouth daily. 07/08/15   Hannah Muthersbaugh, PA-C  etonogestrel (IMPLANON) 68 MG IMPL implant Inject 1 each into the skin once.    Historical Provider, MD  fluticasone (FLONASE) 50 MCG/ACT nasal spray Place 2 sprays into both nostrils daily. 07/08/15   Hannah Muthersbaugh, PA-C  predniSONE (DELTASONE) 20 MG tablet Take 2 tablets (40 mg total) by mouth daily. 07/08/15   Hannah Muthersbaugh, PA-C  Pseudoephedrine-APAP-DM (DAYQUIL PO) Take 15 mLs by mouth daily as needed. For flu and cold symptoms    Historical Provider, MD   Meds Ordered and Administered this Visit  Medications - No data to display  BP 126/83 mmHg  Pulse 67  Temp(Src) 98.3 F (36.8 C) (Oral)  Resp 20  SpO2 98%  LMP 08/21/2015 (Exact Date) No data found.   Physical Exam  Constitutional: She is oriented to person, place, and time. She appears well-developed and well-nourished. No distress.  HENT:  Mouth/Throat: Oropharynx is clear and moist.  Neck: Neck supple.  Cardiovascular: Normal rate, regular rhythm and normal heart sounds.   No murmur heard. Pulmonary/Chest: Effort normal.  Abdominal: Soft. Bowel sounds are normal.  She exhibits no distension. There is no tenderness. There is no rebound and no guarding.  Neurological: She is alert and oriented to person, place, and time.    ED Course  Procedures (including critical care time)  Labs Review Labs Reviewed  C DIFFICILE QUICK SCREEN W PCR REFLEX  GASTROINTESTINAL PANEL BY PCR, STOOL (REPLACES STOOL CULTURE)    Imaging Review No results found.   MDM   1. Diarrhea, unspecified type    Time course is suspicious for food poisoning, but symptoms have been persistent beyond what is typical. Stool collected and will be sent for C. difficile as well  as GI panel. Okay to take a small amount of Imodium to slow the diarrhea while waiting for results. Discussed adequate fluid intake. Return precautions reviewed.    Charm RingsErin J Haelee Bolen, MD 09/05/15 [phone]

## 2015-09-05 NOTE — ED Notes (Signed)
[facility] complaint of N/V/D that started 4 days ago after a cookout.

## 2015-09-06 ENCOUNTER — Telehealth: Payer: Self-pay | Admitting: Internal Medicine

## 2015-09-06 LAB — GASTROINTESTINAL PANEL BY PCR, STOOL (REPLACES STOOL CULTURE)
ADENOVIRUS F40/41: NOT DETECTED
Astrovirus: NOT DETECTED
CAMPYLOBACTER SPECIES: NOT DETECTED
Cryptosporidium: NOT DETECTED
Cyclospora cayetanensis: NOT DETECTED
E. coli O157: NOT DETECTED
ENTAMOEBA HISTOLYTICA: NOT DETECTED
ENTEROAGGREGATIVE E COLI (EAEC): DETECTED — AB
ENTEROPATHOGENIC E COLI (EPEC): NOT DETECTED
Enterotoxigenic E coli (ETEC): NOT DETECTED
GIARDIA LAMBLIA: NOT DETECTED
NOROVIRUS GI/GII: NOT DETECTED
PLESIMONAS SHIGELLOIDES: NOT DETECTED
Rotavirus A: NOT DETECTED
SALMONELLA SPECIES: NOT DETECTED
SHIGELLA/ENTEROINVASIVE E COLI (EIEC): NOT DETECTED
Sapovirus (I, II, IV, and V): NOT DETECTED
Shiga like toxin producing E coli (STEC): NOT DETECTED
VIBRIO CHOLERAE: NOT DETECTED
Vibrio species: NOT DETECTED
YERSINIA ENTEROCOLITICA: NOT DETECTED

## 2015-09-06 MED ORDER — CIPROFLOXACIN HCL 500 MG PO TABS: 500.0000 mg | ORAL_TABLET | Freq: Two times a day (BID) | ORAL | Status: AC

## 2015-09-06 NOTE — ED Notes (Signed)
Lab   Contacted  This  Is  Final    Result   Dr    Gardiner CoinsMurray  Said  She  Will    Send  E  rx  As     Noted   Pt   Has  Not  Called     Back    When pt  Calls  Back   Inform of  Result  And  Continue     Supportive  Therapy        Return if  Worse      Gerilyn NestleRamon  Will  Follow   Up

## 2015-09-06 NOTE — ED Notes (Signed)
Pt  Called back  Informed  Of  Lab  Results      Pt      Advised  To  Get rx  Filled    She  States  She  Is  Feeling  Better

## 2015-09-06 NOTE — ED Notes (Signed)
Attempted to  Call  Patient  To inform of  Lab  Results        And   To  See  How  She  Is  Doing      No  Answer     On   Provided  Number     Message    For  Patient  To  Call  Back

## 2015-09-06 NOTE — ED Notes (Signed)
Stool panel positive Enteraggregative E coli.    Will ask clinical staff to find out from lab if E coli sensitivities will be available.    If so, will hold off on abx pending the sensitivities. Patient should continue to push fluids, have diet as tolerated, and use small amounts of imodium otc as needed for diarrhea.    If no sensitivities will be available will send rx for cipro 500mg  bid x 3d #6 tabs no refills to pharmacy of record, [facility] [address]   Ria ClockLaura Tranesha Lessner MD   Received note that sensitivities will not be available.  [facility]  Recheck for increasing frequency stool, blood in stool, new fever >100.5, or if not starting to have less frequent/less watery stools in next several days. Ria ClockLaura Epifania Littrell MD

## 2015-09-12 NOTE — Telephone Encounter (Signed)
LM on pt's VM to see how she is doing.

## 2016-03-04 ENCOUNTER — Encounter (HOSPITAL_COMMUNITY): Payer: Self-pay | Admitting: Emergency Medicine

## 2016-03-04 ENCOUNTER — Emergency Department (HOSPITAL_COMMUNITY)
Admission: EM | Admit: 2016-03-04 | Discharge: 2016-03-04 | Disposition: A | Discharge status: Home / Self Care | Payer: Self-pay | Attending: Emergency Medicine | Admitting: Emergency Medicine

## 2016-03-04 ENCOUNTER — Emergency Department (HOSPITAL_COMMUNITY): Payer: Self-pay

## 2016-03-04 DIAGNOSIS — Z87891 Personal history of nicotine dependence: Secondary | ICD-10-CM | POA: Insufficient documentation

## 2016-03-04 DIAGNOSIS — R52 Pain, unspecified: Secondary | ICD-10-CM | POA: Insufficient documentation

## 2016-03-04 DIAGNOSIS — J111 Influenza due to unidentified influenza virus with other respiratory manifestations: Secondary | ICD-10-CM

## 2016-03-04 DIAGNOSIS — R1110 Vomiting, unspecified: Secondary | ICD-10-CM | POA: Insufficient documentation

## 2016-03-04 DIAGNOSIS — I10 Essential (primary) hypertension: Secondary | ICD-10-CM | POA: Insufficient documentation

## 2016-03-04 DIAGNOSIS — R69 Illness, unspecified: Secondary | ICD-10-CM

## 2016-03-04 DIAGNOSIS — R05 Cough: Secondary | ICD-10-CM | POA: Insufficient documentation

## 2016-03-04 LAB — CBC
HCT: 35 % — ABNORMAL LOW (ref 36.0–46.0)
Hemoglobin: 12.2 g/dL (ref 12.0–15.0)
MCH: 26.9 pg (ref 26.0–34.0)
MCHC: 34.9 g/dL (ref 30.0–36.0)
MCV: 77.1 fL — AB (ref 78.0–100.0)
PLATELETS: 208 10*3/uL (ref 150–400)
RBC: 4.54 MIL/uL (ref 3.87–5.11)
RDW: 13.8 % (ref 11.5–15.5)
WBC: 4.5 10*3/uL (ref 4.0–10.5)

## 2016-03-04 LAB — URINALYSIS, ROUTINE W REFLEX MICROSCOPIC
Bilirubin Urine: NEGATIVE
Glucose, UA: NEGATIVE mg/dL
Ketones, ur: NEGATIVE mg/dL
Leukocytes, UA: NEGATIVE
Nitrite: NEGATIVE
PH: 6 (ref 5.0–8.0)
Protein, ur: NEGATIVE mg/dL
SPECIFIC GRAVITY, URINE: 1.009 (ref 1.005–1.030)

## 2016-03-04 LAB — COMPREHENSIVE METABOLIC PANEL
ALT: 20 U/L (ref 14–54)
AST: 22 U/L (ref 15–41)
Albumin: 4 g/dL (ref 3.5–5.0)
Alkaline Phosphatase: 48 U/L (ref 38–126)
Anion gap: 5 (ref 5–15)
BUN: 7 mg/dL (ref 6–20)
CHLORIDE: 108 mmol/L (ref 101–111)
CO2: 23 mmol/L (ref 22–32)
CREATININE: 0.9 mg/dL (ref 0.44–1.00)
Calcium: 8.6 mg/dL — ABNORMAL LOW (ref 8.9–10.3)
GFR calc Af Amer: >60 mL/min (ref >60)
Glucose, Bld: 96 mg/dL (ref 65–99)
Potassium: 3.5 mmol/L (ref 3.5–5.1)
Sodium: 136 mmol/L (ref 135–145)
Total Bilirubin: 0.3 mg/dL (ref 0.3–1.2)
Total Protein: 7 g/dL (ref 6.5–8.1)

## 2016-03-04 LAB — I-STAT BETA HCG BLOOD, ED (MC, WL, AP ONLY): I-stat hCG, quantitative: <5 m[IU]/mL (ref <5)

## 2016-03-04 LAB — LIPASE, BLOOD: Lipase: 16 U/L (ref 11–51)

## 2016-03-04 MED ORDER — ONDANSETRON HCL 4 MG PO TABS: 4.0000 mg | ORAL_TABLET | Freq: Three times a day (TID) | ORAL | 0 refills | Status: DC | PRN

## 2016-03-04 MED ORDER — SODIUM CHLORIDE 0.9 % IV BOLUS (SEPSIS)
1000.0000 mL | Freq: Once | INTRAVENOUS | Status: AC
  Administered 2016-03-04: 1000 mL via INTRAVENOUS

## 2016-03-04 MED ORDER — PREDNISONE 20 MG PO TABS: ORAL_TABLET | ORAL | 0 refills | Status: DC

## 2016-03-04 MED ORDER — METHYLPREDNISOLONE SODIUM SUCC 125 MG IJ SOLR
125.0000 mg | Freq: Once | INTRAMUSCULAR | Status: AC
  Administered 2016-03-04: 125 mg via INTRAVENOUS
  Filled 2016-03-04: qty 2

## 2016-03-04 MED ORDER — IPRATROPIUM-ALBUTEROL 0.5-2.5 (3) MG/3ML IN SOLN
3.0000 mL | RESPIRATORY_TRACT | Status: AC
Start: 2016-03-04 — End: 2016-03-04
  Administered 2016-03-04 (×2): 3 mL via RESPIRATORY_TRACT
  Filled 2016-03-04: qty 3

## 2016-03-04 MED ORDER — ONDANSETRON HCL 4 MG/2ML IJ SOLN
4.0000 mg | Freq: Once | INTRAMUSCULAR | Status: AC
  Administered 2016-03-04: 4 mg via INTRAVENOUS
  Filled 2016-03-04: qty 2

## 2016-03-04 MED ORDER — ALBUTEROL SULFATE HFA 108 (90 BASE) MCG/ACT IN AERS
2.0000 | INHALATION_SPRAY | Freq: Once | RESPIRATORY_TRACT | Status: AC
  Administered 2016-03-04: 2 via RESPIRATORY_TRACT
  Filled 2016-03-04: qty 6.7

## 2016-03-04 NOTE — ED Notes (Signed)
Pt ambulated to the restroom without assistance or difficulty for urine sample.

## 2016-03-04 NOTE — ED Triage Notes (Signed)
Patient reports nausea/vomiting/body aches/headache starting today. Patient conscious,alert, oriented, speaking in full sentences.

## 2016-03-04 NOTE — ED Provider Notes (Signed)
[facility]-EMERGENCY DEPT Provider Note   CSN: [id number] Arrival date & time: 03/04/16  1724     History   Chief Complaint Chief Complaint  Patient presents with  . Emesis  . Generalized Body Aches    HPI Jenny Mann is a 36 y.o. female.   Emesis   This is a new problem. The current episode started yesterday. The problem occurs 2 to 4 times per day. The problem has been gradually improving. The emesis has an appearance of stomach contents. The maximum temperature recorded prior to her arrival was 100 to 100.9 F. Associated symptoms include arthralgias, chills, cough, diarrhea and myalgias. Risk factors include ill contacts.    Past Medical History:  Diagnosis Date  . Abnormal Pap smear   . Anemia September 15 2012   office called her and told her to take Fe  . Hypertension   . PIH (pregnancy induced hypertension)    ?2nd and 3rd pregnancy    Patient Active Problem List   Diagnosis Date Noted  . Anemia, iron deficiency 09/11/2012  . Asymptomatic bacteriuria in pregnancy 09/11/2012  . Chronic hypertension in pregnancy 05/28/2012  . Triplet pregnancy in second trimester 05/13/2012  . Sickle cell trait (HCC) 04/22/2012  . Supervision of high-risk pregnancy 04/15/2012  . Previous cesarean delivery affecting pregnancy, antepartum 04/15/2012  . Hx of preeclampsia, prior pregnancy, currently pregnant 04/15/2012  . 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 CESAREAN SECTION    . CESAREAN SECTION N/A 09/25/2012   Procedure: REPEAT  CESAREAN SECTION/TRIPLETS;  Surgeon: Antionette CharLisa Jackson-Moore, MD;  Location: [facility];  Service: Obstetrics;  Laterality: N/A;    OB History    Gravida Para Term Preterm AB Living   5 4 3 1 1 4    SAB TAB Ectopic Multiple Live Births   0 1 0 1 4       Home Medications    Prior to Admission medications   Medication Sig Start Date End Date Taking? Authorizing Provider  etonogestrel (IMPLANON) 68 MG  IMPL implant Inject 1 each into the skin once.   Yes Historical Provider, MD  ibuprofen (ADVIL,MOTRIN) 200 MG tablet Take 600 mg by mouth every 6 (six) hours as needed for headache.   Yes Historical Provider, MD  Pseudoephedrine-APAP-DM (DAYQUIL PO) Take 15 mLs by mouth daily as needed. For flu and cold symptoms   Yes Historical Provider, MD  ondansetron (ZOFRAN) 4 MG tablet Take 1 tablet (4 mg total) by mouth every 8 (eight) hours as needed for nausea or vomiting. 03/04/16   Marily MemosJason Brailee Riede, MD  predniSONE (DELTASONE) 20 MG tablet 2 tabs po daily x 4 days 03/04/16   Marily MemosJason Nakai Yard, MD    Family History Family History  Problem Relation Age of Onset  . Diabetes Maternal Grandmother   . Diabetes Paternal Grandmother   . Kidney disease Paternal Grandmother     Social History Social History  Substance Use Topics  . Smoking status: Former Games developermoker  . Smokeless tobacco: Never Used  . Alcohol use No     Allergies   Shellfish allergy   Review of Systems Review of Systems  Constitutional: Positive for chills.  Respiratory: Positive for cough.   Gastrointestinal: Positive for diarrhea and vomiting.  Musculoskeletal: Positive for arthralgias and myalgias.     Physical Exam Updated Vital Signs BP 122/69 (BP Location: Left Arm)   Pulse 80   Temp 98.4 F (36.9 C) (Oral)  Resp 18   Ht 5\' 3"  (1.6 m)   Wt (!) 321 lb (145.6 kg)   LMP 02/24/2016   SpO2 95%   BMI 56.86 kg/m   Physical Exam  Constitutional: She is oriented to person, place, and time. She appears well-developed and well-nourished.  HENT:  Head: Normocephalic and atraumatic.  Eyes: Conjunctivae and EOM are normal.  Neck: Normal range of motion.  Cardiovascular: Normal rate and regular rhythm.   Pulmonary/Chest: Effort normal. No stridor. No respiratory distress. She has wheezes.  Abdominal: Soft. She exhibits no distension.  Musculoskeletal: Normal range of motion. She exhibits no edema or deformity.  Neurological: She is  alert and oriented to person, place, and time. No cranial nerve deficit.  Skin: Skin is warm and dry. No erythema. No pallor.  Nursing note and vitals reviewed.    ED Treatments / Results  Labs (all labs ordered are listed, but only abnormal results are displayed) Labs Reviewed  COMPREHENSIVE METABOLIC PANEL - Abnormal; Notable for the following:       Result Value   Calcium 8.6 (*)    All other components within normal limits  CBC - Abnormal; Notable for the following:    HCT 35.0 (*)    MCV 77.1 (*)    All other components within normal limits  URINALYSIS, ROUTINE W REFLEX MICROSCOPIC - Abnormal; Notable for the following:    Color, Urine STRAW (*)    Hgb urine dipstick SMALL (*)    Bacteria, UA RARE (*)    Squamous Epithelial / LPF 0-5 (*)    All other components within normal limits  INFLUENZA PANEL BY PCR (TYPE A & B, H1N1) - Abnormal; Notable for the following:    Influenza A By PCR POSITIVE (*)    All other components within normal limits  LIPASE, BLOOD  I-STAT BETA HCG BLOOD, ED ([facility], [facility], [facility] ONLY)    EKG  EKG Interpretation None       Radiology No results found.  Procedures Procedures (including critical care time)  Medications Ordered in ED Medications  ipratropium-albuterol (DUONEB) 0.5-2.5 (3) MG/3ML nebulizer solution 3 mL (3 mLs Nebulization Given 03/04/16 2022)  ondansetron (ZOFRAN) injection 4 mg (4 mg Intravenous Given 03/04/16 1952)  sodium chloride 0.9 % bolus 1,000 mL (0 mLs Intravenous Stopped 03/04/16 2115)  methylPREDNISolone sodium succinate (SOLU-MEDROL) 125 mg/2 mL injection 125 mg (125 mg Intravenous Given 03/04/16 1952)  albuterol (PROVENTIL HFA;VENTOLIN HFA) 108 (90 Base) MCG/ACT inhaler 2 puff (2 puffs Inhalation Given 03/04/16 2302)     Initial Impression / Assessment and Plan / ED Course  I have reviewed the triage vital signs and the nursing notes.  Pertinent labs & imaging results that were available during my care of the patient were  reviewed by me and considered in my medical decision making (see chart for details).  Clinical Course    Influenza like illness. Will treat symptomatically. Significant improvement in her symptoms ED, will dc on same. Tolerating PO. VSS. No distress, stable for dc and strict return precautions. Denied tamiflu and no RF's to necessitate the risk.    Final Clinical Impressions(s) / ED Diagnoses   Final diagnoses:  Influenza-like illness    New Prescriptions Discharge Medication List as of 03/04/2016 11:16 PM    START taking these medications   Details  ondansetron (ZOFRAN) 4 MG tablet Take 1 tablet (4 mg total) by mouth every 8 (eight) hours as needed for nausea or vomiting., Starting Mon 03/04/2016, Print  predniSONE (DELTASONE) 20 MG tablet 2 tabs po daily x 4 days, Print         Marily Memos, MD 03/07/16 [id number]

## 2016-03-04 NOTE — ED Notes (Signed)
Patient transported to X-ray 

## 2016-03-04 NOTE — ED Notes (Signed)
Patient returned from X-ray 

## 2016-03-05 LAB — INFLUENZA PANEL BY PCR (TYPE A & B)
Influenza A By PCR: POSITIVE — AB
Influenza B By PCR: NEGATIVE

## 2016-03-06 ENCOUNTER — Encounter (HOSPITAL_COMMUNITY): Payer: Self-pay | Admitting: Emergency Medicine

## 2016-08-19 DIAGNOSIS — A084 Viral intestinal infection, unspecified: Secondary | ICD-10-CM | POA: Insufficient documentation

## 2016-08-19 DIAGNOSIS — I10 Essential (primary) hypertension: Secondary | ICD-10-CM | POA: Insufficient documentation

## 2016-08-19 DIAGNOSIS — Z87891 Personal history of nicotine dependence: Secondary | ICD-10-CM | POA: Insufficient documentation

## 2016-08-20 ENCOUNTER — Encounter (HOSPITAL_COMMUNITY): Payer: Self-pay

## 2016-08-20 ENCOUNTER — Emergency Department (HOSPITAL_COMMUNITY)
Admission: EM | Admit: 2016-08-20 | Discharge: 2016-08-20 | Disposition: A | Discharge status: Home / Self Care | Payer: Medicaid Other | Attending: Emergency Medicine | Admitting: Emergency Medicine

## 2016-08-20 DIAGNOSIS — A084 Viral intestinal infection, unspecified: Secondary | ICD-10-CM

## 2016-08-20 LAB — COMPREHENSIVE METABOLIC PANEL
ALT: 17 U/L (ref 14–54)
AST: 19 U/L (ref 15–41)
Albumin: 3.8 g/dL (ref 3.5–5.0)
Alkaline Phosphatase: 58 U/L (ref 38–126)
Anion gap: 6 (ref 5–15)
BUN: 10 mg/dL (ref 6–20)
CO2: 25 mmol/L (ref 22–32)
Calcium: 8.7 mg/dL — ABNORMAL LOW (ref 8.9–10.3)
Chloride: 107 mmol/L (ref 101–111)
Creatinine, Ser: 0.76 mg/dL (ref 0.44–1.00)
GFR calc Af Amer: >60 mL/min (ref >60)
GFR calc non Af Amer: >60 mL/min (ref >60)
Glucose, Bld: 95 mg/dL (ref 65–99)
Potassium: 3.8 mmol/L (ref 3.5–5.1)
Sodium: 138 mmol/L (ref 135–145)
Total Bilirubin: 0.6 mg/dL (ref 0.3–1.2)
Total Protein: 7.1 g/dL (ref 6.5–8.1)

## 2016-08-20 LAB — URINALYSIS, ROUTINE W REFLEX MICROSCOPIC
BILIRUBIN URINE: NEGATIVE
GLUCOSE, UA: NEGATIVE mg/dL
HGB URINE DIPSTICK: NEGATIVE
KETONES UR: NEGATIVE mg/dL
Leukocytes, UA: NEGATIVE
Nitrite: NEGATIVE
PROTEIN: NEGATIVE mg/dL
Specific Gravity, Urine: 1.009 (ref 1.005–1.030)
pH: 5 (ref 5.0–8.0)

## 2016-08-20 LAB — CBC
HCT: 36.3 % (ref 36.0–46.0)
Hemoglobin: 12.6 g/dL (ref 12.0–15.0)
MCH: 26.9 pg (ref 26.0–34.0)
MCHC: 34.7 g/dL (ref 30.0–36.0)
MCV: 77.4 fL — AB (ref 78.0–100.0)
PLATELETS: 233 10*3/uL (ref 150–400)
RBC: 4.69 MIL/uL (ref 3.87–5.11)
RDW: 13.9 % (ref 11.5–15.5)
WBC: 8.3 10*3/uL (ref 4.0–10.5)

## 2016-08-20 LAB — LIPASE, BLOOD: LIPASE: 21 U/L (ref 11–51)

## 2016-08-20 LAB — I-STAT BETA HCG BLOOD, ED (MC, WL, AP ONLY)

## 2016-08-20 MED ORDER — SODIUM CHLORIDE 0.9 % IV BOLUS (SEPSIS)
1000.0000 mL | Freq: Once | INTRAVENOUS | Status: AC
  Administered 2016-08-20: 1000 mL via INTRAVENOUS

## 2016-08-20 MED ORDER — ONDANSETRON HCL 4 MG/2ML IJ SOLN
4.0000 mg | Freq: Once | INTRAMUSCULAR | Status: AC
  Administered 2016-08-20: 4 mg via INTRAVENOUS
  Filled 2016-08-20: qty 2

## 2016-08-20 MED ORDER — ONDANSETRON 4 MG PO TBDP: 4.0000 mg | ORAL_TABLET | Freq: Once | ORAL | Status: DC | PRN

## 2016-08-20 MED ORDER — ONDANSETRON HCL 4 MG PO TABS: 4.0000 mg | ORAL_TABLET | Freq: Four times a day (QID) | ORAL | 0 refills | Status: DC

## 2016-08-20 NOTE — ED Provider Notes (Signed)
[facility]-EMER[facility]ENCY DEPT Provider Note   CSN: [id number] Arrival date & time: 08/19/16  2133     History   Chief Complaint Chief Complaint  Patient presents with  . Emesis  . Diarrhea    HPI Jenny Mann is a 36 y.o. female.  HPI  36 y.o. female with a hx of HTN, presents to the Emergency Department today due to N/V/D that started today. Associated abdominal cramping. Notes daughter getting over similar symptoms recently. No fevers. Rates pain 8/10 and cramping. No CP/SOB. No meds PTA. No recent travel. No recent ABX usage. No URI symptoms. No other symptoms noted.   Past Medical History:  Diagnosis Date  . Abnormal Pap smear   . Anemia September 15 2012   office called her and told her to take Fe  . Hypertension   . PIH (pregnancy induced hypertension)    ?2nd and 3rd pregnancy    Patient Active Problem List   Diagnosis Date Noted  . Anemia, iron deficiency 09/11/2012  . Asymptomatic bacteriuria in pregnancy 09/11/2012  . Chronic hypertension in pregnancy 05/28/2012  . Triplet pregnancy in second trimester 05/13/2012  . Sickle cell trait (HCC) 04/22/2012  . Supervision of high-risk pregnancy 04/15/2012  . Previous cesarean delivery affecting pregnancy, antepartum 04/15/2012  . Hx of preeclampsia, prior pregnancy, currently pregnant 04/15/2012  . 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 CESAREAN SECTION    . CESAREAN SECTION N/A 09/25/2012   Procedure: REPEAT  CESAREAN SECTION/TRIPLETS;  Surgeon: Antionette CharLisa Jackson-Moore, MD;  Location: [facility];  Service: Obstetrics;  Laterality: N/A;    OB History    Gravida Para Term Preterm AB Living   5 4 3 1 1 4    SAB TAB Ectopic Multiple Live Births   0 1 0 1 4       Home Medications    Prior to Admission medications   Medication Sig Start Date End Date Taking? Authorizing Provider  ondansetron (ZOFRAN) 4 MG tablet Take 1 tablet (4 mg total) by mouth every 8 (eight) hours  as needed for nausea or vomiting. Patient not taking: Reported on 08/20/2016 03/04/16   Mesner, Barbara CowerJason, MD  predniSONE (DELTASONE) 20 MG tablet 2 tabs po daily x 4 days Patient not taking: Reported on 08/20/2016 03/04/16   Mesner, Barbara CowerJason, MD    Family History Family History  Problem Relation Age of Onset  . Diabetes Maternal Grandmother   . Diabetes Paternal Grandmother   . Kidney disease Paternal Grandmother     Social History Social History  Substance Use Topics  . Smoking status: Former Games developermoker  . Smokeless tobacco: Never Used  . Alcohol use No     Allergies   Shellfish allergy   Review of Systems Review of Systems ROS reviewed and all are negative for acute change except as noted in the HPI.  Physical Exam Updated Vital Signs BP 119/65 (BP Location: Left Arm)   Pulse 65   Temp 98.2 F (36.8 C) (Oral)   Resp 20   Ht 5\' 3"  (1.6 m)   Wt (!) 138.3 kg (305 lb)   LMP 08/13/2016   SpO2 100%   BMI 54.03 kg/m   Physical Exam  Constitutional: She is oriented to person, place, and time. Vital signs are normal. She appears well-developed and well-nourished.  HENT:  Head: Normocephalic and atraumatic.  Right Ear: Hearing normal.  Left Ear: Hearing normal.  Eyes: Conjunctivae and EOM are  normal. Pupils are equal, round, and reactive to light.  Neck: Normal range of motion. Neck supple.  Cardiovascular: Normal rate, regular rhythm, normal heart sounds and intact distal pulses.   Pulmonary/Chest: Effort normal and breath sounds normal.  Abdominal: Soft. Normal appearance and bowel sounds are normal. There is no tenderness. There is no rigidity, no rebound, no guarding, no CVA tenderness, no tenderness at McBurney's point and negative Murphy's sign.  Musculoskeletal: Normal range of motion.  Neurological: She is alert and oriented to person, place, and time.  Skin: Skin is warm and dry.  Psychiatric: She has a normal mood and affect. Her speech is normal and behavior is normal.  Thought content normal.  Nursing note and vitals reviewed.  ED Treatments / Results  Labs (all labs ordered are listed, but only abnormal results are displayed) Labs Reviewed  COMPREHENSIVE METABOLIC PANEL - Abnormal; Notable for the following:       Result Value   Calcium 8.7 (*)    All other components within normal limits  CBC - Abnormal; Notable for the following:    MCV 77.4 (*)    All other components within normal limits  LIPASE, BLOOD  URINALYSIS, ROUTINE W REFLEX MICROSCOPIC  I-STAT BETA HCG BLOOD, ED ([facility], [facility], [facility] ONLY)    EKG  EKG Interpretation None       Radiology No results found.  Procedures Procedures (including critical care time)  Medications Ordered in ED Medications  ondansetron (ZOFRAN-ODT) disintegrating tablet 4 mg (not administered)     Initial Impression / Assessment and Plan / ED Course  I have reviewed the triage vital signs and the nursing notes.  Pertinent labs & imaging results that were available during my care of the patient were reviewed by me and considered in my medical decision making (see chart for details).  Final Clinical Impressions(s) / ED Diagnoses  {I have reviewed and evaluated the relevant laboratory values.   {I have reviewed the relevant previous healthcare records.  {I obtained HPI from historian.   ED Course:  Assessment: Patient is a 36 y.o. female presents with abdominal pain with N/V/D today. Daughter with similar symptoms. On exam, nontoxic, nonseptic appearing, in no apparent distress. Patient's pain and other symptoms adequately managed in emergency department.  Fluid bolus given.  Labs and vitals reviewed.  Patient does not meet the SIRS or Sepsis criteria.  On repeat exam patient does not have a surgical abdomen and there are no peritoneal signs.  No indication of appendicitis, bowel obstruction, bowel perforation, cholecystitis, diverticulitis, PID or ectopic pregnancy. Likely Viral gastroenteritis. Patient  discharged home with symptomatic treatment and given strict instructions for follow-up with their primary care physician.  I have also discussed reasons to return immediately to the ER.  Patient expresses understanding and agrees with plan  Disposition/Plan:  DC Home Additional Verbal discharge instructions given and discussed with patient.  Pt Instructed to f/u with PCP in the next week for evaluation and treatment of symptoms. Return precautions given Pt acknowledges and agrees with plan  Supervising Physician Zadie Rhine, MD  Final diagnoses:  Viral gastroenteritis    New Prescriptions New Prescriptions   No medications on file     Audry Pili, Cordelia Poche 08/20/16 0441    Zadie Rhine, MD 08/21/16 [phone]

## 2016-08-20 NOTE — Discharge Instructions (Signed)
Please read and follow all provided instructions.  Your diagnoses today include:  1. Viral gastroenteritis     Tests performed today include: Blood counts and electrolytes Blood tests to check liver and kidney function Blood tests to check pancreas function Urine test to look for infection and pregnancy (in women) Vital signs. See below for your results today.   Medications prescribed:   Take any prescribed medications only as directed.  Home care instructions:  Follow any educational materials contained in this packet.  Follow-up instructions: Please follow-up with your primary care provider in the next 2 days for further evaluation of your symptoms.    Return instructions:  SEEK IMMEDIATE MEDICAL ATTENTION IF: The pain does not go away or becomes severe  A temperature above 101F develops  Repeated vomiting occurs (multiple episodes)  The pain becomes localized to portions of the abdomen. The right side could possibly be appendicitis. In an adult, the left lower portion of the abdomen could be colitis or diverticulitis.  Blood is being passed in stools or vomit (bright red or black tarry stools)  You develop chest pain, difficulty breathing, dizziness or fainting, or become confused, poorly responsive, or inconsolable (young children) If you have any other emergent concerns regarding your health  Additional Information: Abdominal (belly) pain can be caused by many things. Your caregiver performed an examination and possibly ordered blood/urine tests and imaging (CT scan, x-rays, ultrasound). Many cases can be observed and treated at home after initial evaluation in the emergency department. Even though you are being discharged home, abdominal pain can be unpredictable. Therefore, you need a repeated exam if your pain does not resolve, returns, or worsens. Most patients with abdominal pain don't have to be admitted to the hospital or have surgery, but serious problems like  appendicitis and gallbladder attacks can start out as nonspecific pain. Many abdominal conditions cannot be diagnosed in one visit, so follow-up evaluations are very important.  Your vital signs today were: BP (!) 111/43 (BP Location: Left Arm)    Pulse (!) 54    Temp 98.2 F (36.8 C) (Oral)    Resp 18    Ht 5\' 3"  (1.6 m)    Wt (!) 138.3 kg (305 lb)    LMP 08/13/2016    SpO2 100%    BMI 54.03 kg/m  If your blood pressure (bp) was elevated above 135/85 this visit, please have this repeated by your doctor within one month. --------------

## 2016-08-20 NOTE — ED Triage Notes (Signed)
Pt complains of nausea vomiting and diarrhea since earlier today Pt also states that she's having severe abdominal cramping

## 2016-11-12 ENCOUNTER — Ambulatory Visit (HOSPITAL_COMMUNITY)
Admission: EM | Admit: 2016-11-12 | Discharge: 2016-11-12 | Disposition: A | Discharge status: Home / Self Care | Payer: Self-pay | Attending: Emergency Medicine | Admitting: Emergency Medicine

## 2016-11-12 ENCOUNTER — Encounter (HOSPITAL_COMMUNITY): Payer: Self-pay | Admitting: Emergency Medicine

## 2016-11-12 DIAGNOSIS — R112 Nausea with vomiting, unspecified: Secondary | ICD-10-CM

## 2016-11-12 DIAGNOSIS — R197 Diarrhea, unspecified: Secondary | ICD-10-CM

## 2016-11-12 DIAGNOSIS — A084 Viral intestinal infection, unspecified: Secondary | ICD-10-CM

## 2016-11-12 MED ORDER — ONDANSETRON 4 MG PO TBDP
4.0000 mg | ORAL_TABLET | Freq: Once | ORAL | Status: AC
  Administered 2016-11-12: 4 mg via ORAL

## 2016-11-12 MED ORDER — ONDANSETRON 4 MG PO TBDP
ORAL_TABLET | ORAL | Status: AC
  Filled 2016-11-12: qty 1

## 2016-11-12 MED ORDER — ONDANSETRON HCL 4 MG PO TABS: 4.0000 mg | ORAL_TABLET | Freq: Four times a day (QID) | ORAL | 0 refills | Status: DC

## 2016-11-12 NOTE — ED Triage Notes (Signed)
PT reports stomach upset with diarrhea and vomiting that started yesterday. PT's daughter had same symptoms a few days ago. Vomiting x2 this AM. Multiple watery stools yesterday and today. Generalized abdominal discomfort.

## 2016-11-12 NOTE — Discharge Instructions (Signed)
You may take Imodium AD for diarrhea however only take one now and then if in 4-5 hours you are still having frequent diarrhea stools may take another one. Try not to take more than 3 day and do not take enough to stop your diarrhea. Staff some diarrhea to shed the virus. Take the Zofran 4 nausea and vomiting as directed. And drink Pedialyte for replacement of fluids and electrolytes. Drink slowly in small amounts frequently at first then one feeling better can drink more volume at a time.

## 2016-11-12 NOTE — ED Provider Notes (Signed)
[facility]    CSN: [id number] Arrival date & time: 11/12/16  1006     History   Chief Complaint Chief Complaint  Patient presents with  . Diarrhea  . Emesis    HPI Jenny Mann is a 36 y.o. female.   36 year old obese female developed nausea and diarrhea yesterday morning. Diarrhea has persisted too numerous to count since chest today morning. Today she has vomited twice. Denies abdominal pain or fever. Denies upper respiratory congestion. Denies blood in the stool. She states that her daughter had similar symptoms last week that lasted for a short period of time      Past Medical History:  Diagnosis Date  . Abnormal Pap smear   . Anemia September 15 2012   office called her and told her to take Fe  . Hypertension   . PIH (pregnancy induced hypertension)    ?2nd and 3rd pregnancy    Patient Active Problem List   Diagnosis Date Noted  . Anemia, iron deficiency 09/11/2012  . Asymptomatic bacteriuria in pregnancy 09/11/2012  . Chronic hypertension in pregnancy 05/28/2012  . Triplet pregnancy in second trimester 05/13/2012  . Sickle cell trait (HCC) 04/22/2012  . Supervision of high-risk pregnancy 04/15/2012  . Previous cesarean delivery affecting pregnancy, antepartum 04/15/2012  . Hx of preeclampsia, prior pregnancy, currently pregnant 04/15/2012  . 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 CESAREAN SECTION    . CESAREAN SECTION N/A 09/25/2012   Procedure: REPEAT  CESAREAN SECTION/TRIPLETS;  Surgeon: Antionette Char, MD;  Location: [facility];  Service: Obstetrics;  Laterality: N/A;    OB History    Gravida Para Term Preterm AB Living   SAB TAB Ectopic Multiple Live Births   0 1 0 1 4       Home Medications    Prior to Admission medications   Medication Sig Start Date End Date Taking? Authorizing Provider  ondansetron (ZOFRAN) 4 MG tablet Take 1 tablet (4 mg total) by mouth  every 6 (six) hours. 11/12/16   Hayden Rasmussen, NP    Family History Family History  Problem Relation Age of Onset  . Diabetes Maternal Grandmother   . Diabetes Paternal Grandmother   . Kidney disease Paternal Grandmother     Social History Social History  Substance Use Topics  . Smoking status: Former Games developer  . Smokeless tobacco: Never Used  . Alcohol use No     Allergies   Shellfish allergy   Review of Systems Review of Systems  Constitutional: Positive for activity change, appetite change and fatigue. Negative for fever.  HENT: Negative.   Respiratory: Negative.   Cardiovascular: Negative.   Gastrointestinal: Positive for diarrhea, nausea and vomiting. Negative for abdominal distention, abdominal pain, anal bleeding, blood in stool, constipation and rectal pain.  Endocrine: Negative for heat intolerance.  Genitourinary: Negative.   Musculoskeletal: Negative.   Neurological: Negative.   All other systems reviewed and are negative.    Physical Exam Triage Vital Signs ED Triage Vitals  Enc Vitals Group     BP 11/12/16 1047 (!) 141/90     Pulse Rate 11/12/16 1047 64     Resp 11/12/16 1047 16     Temp 11/12/16 1047 98.4 F (36.9 C)     Temp Source 11/12/16 1047 Oral     SpO2 11/12/16 1047 100 %     Weight 11/12/16 1046  296 lb (134.3 kg)     Height 11/12/16 1046  (1.626 m)     Head Circumference --      Peak Flow --      Pain Score 11/12/16 1046 3     Pain Loc --      Pain Edu? --      Excl. in GC? --    No data found.   Updated Vital Signs BP (!) 141/90 (BP Location: Right Wrist)   Pulse 64   Temp 98.4 F (36.9 C) (Oral)   Resp 16   Ht  (1.626 m)   Wt 296 lb (134.3 kg)   LMP 10/15/2016   SpO2 100%   BMI 50.81 kg/m   Visual Acuity Right Eye Distance:   Left Eye Distance:   Bilateral Distance:    Right Eye Near:   Left Eye Near:    Bilateral Near:     Physical Exam  Constitutional: She is oriented to person, place, and time. She  appears well-developed and well-nourished. No distress.  HENT:  Head: Normocephalic and atraumatic.  Mouth/Throat: Oropharynx is clear and moist.  Eyes: EOM are normal.  Neck: Neck supple.  Cardiovascular: Normal rate, regular rhythm, normal heart sounds and intact distal pulses.   Pulmonary/Chest: Effort normal and breath sounds normal. No respiratory distress. She has no wheezes.  Abdominal: Soft. Bowel sounds are normal. She exhibits no distension.  IVs. Tenderness to the epigastrium. Mild tenderness across the mid abdomen. There is also tenderness to the left costal margin/ribs. No rebound, guarding.  Musculoskeletal: Normal range of motion. She exhibits no edema.  Lymphadenopathy:    She has no cervical adenopathy.  Neurological: She is alert and oriented to person, place, and time.  Skin: Skin is warm and dry.  Psychiatric: She has a normal mood and affect.  Nursing note and vitals reviewed.    UC Treatments / Results  Labs (all labs ordered are listed, but only abnormal results are displayed) Labs Reviewed - No data to display  EKG  EKG Interpretation None       Radiology No results found.  Procedures Procedures (including critical care time)  Medications Ordered in UC Medications  ondansetron (ZOFRAN-ODT) disintegrating tablet 4 mg (4 mg Oral Given 11/12/16 1109)     Initial Impression / Assessment and Plan / UC Course  I have reviewed the triage vital signs and the nursing notes.  Pertinent labs & imaging results that were available during my care of the patient were reviewed by me and considered in my medical decision making (see chart for details).     You may take Imodium AD for diarrhea however only take one now and then if in 4-5 hours you are still having frequent diarrhea stools may take another one. Try not to take more than 3 day and do not take enough to stop your diarrhea. Staff some diarrhea to shed the virus. Take the Zofran 4 nausea and  vomiting as directed. And drink Pedialyte for replacement of fluids and electrolytes. Drink slowly in small amounts frequently at first then one feeling better can drink more volume at a time.   Final Clinical Impressions(s) / UC Diagnoses   Final diagnoses:  Viral gastroenteritis  Nausea vomiting and diarrhea    New Prescriptions New Prescriptions   ONDANSETRON (ZOFRAN) 4 MG TABLET    Take 1 tablet (4 mg total) by mouth every 6 (six) hours.     Controlled Substance Prescriptions [place] Controlled Substance  Registry consulted? Not Applicable   Hayden RasmussenMabe, Sumit Branham, NP 11/12/16 1114

## 2017-05-09 IMAGING — CR DG KNEE COMPLETE 4+V*L*
4 series · 4 of 4 positions shown · non-contrast
Comparison: Left knee radiographs performed 12/10/2005

CLINICAL DATA: Acute onset of left lateral hip pain, radiating to
the left knee. Status post fall. Initial encounter.

EXAM:
LEFT KNEE - COMPLETE 4+ VIEW

[knee ap]
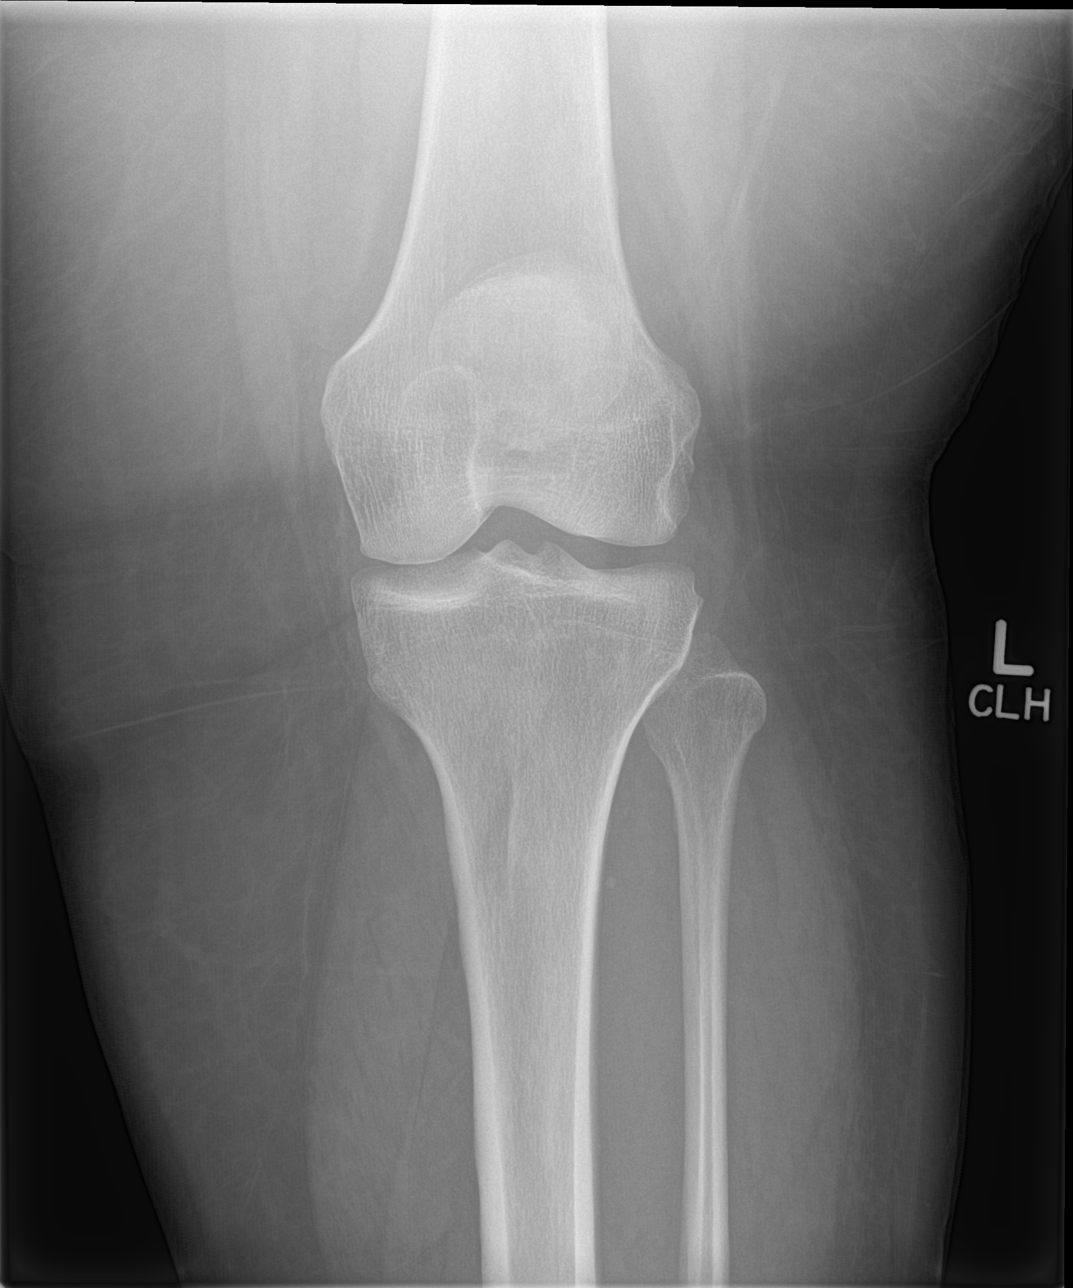

[knee lat]
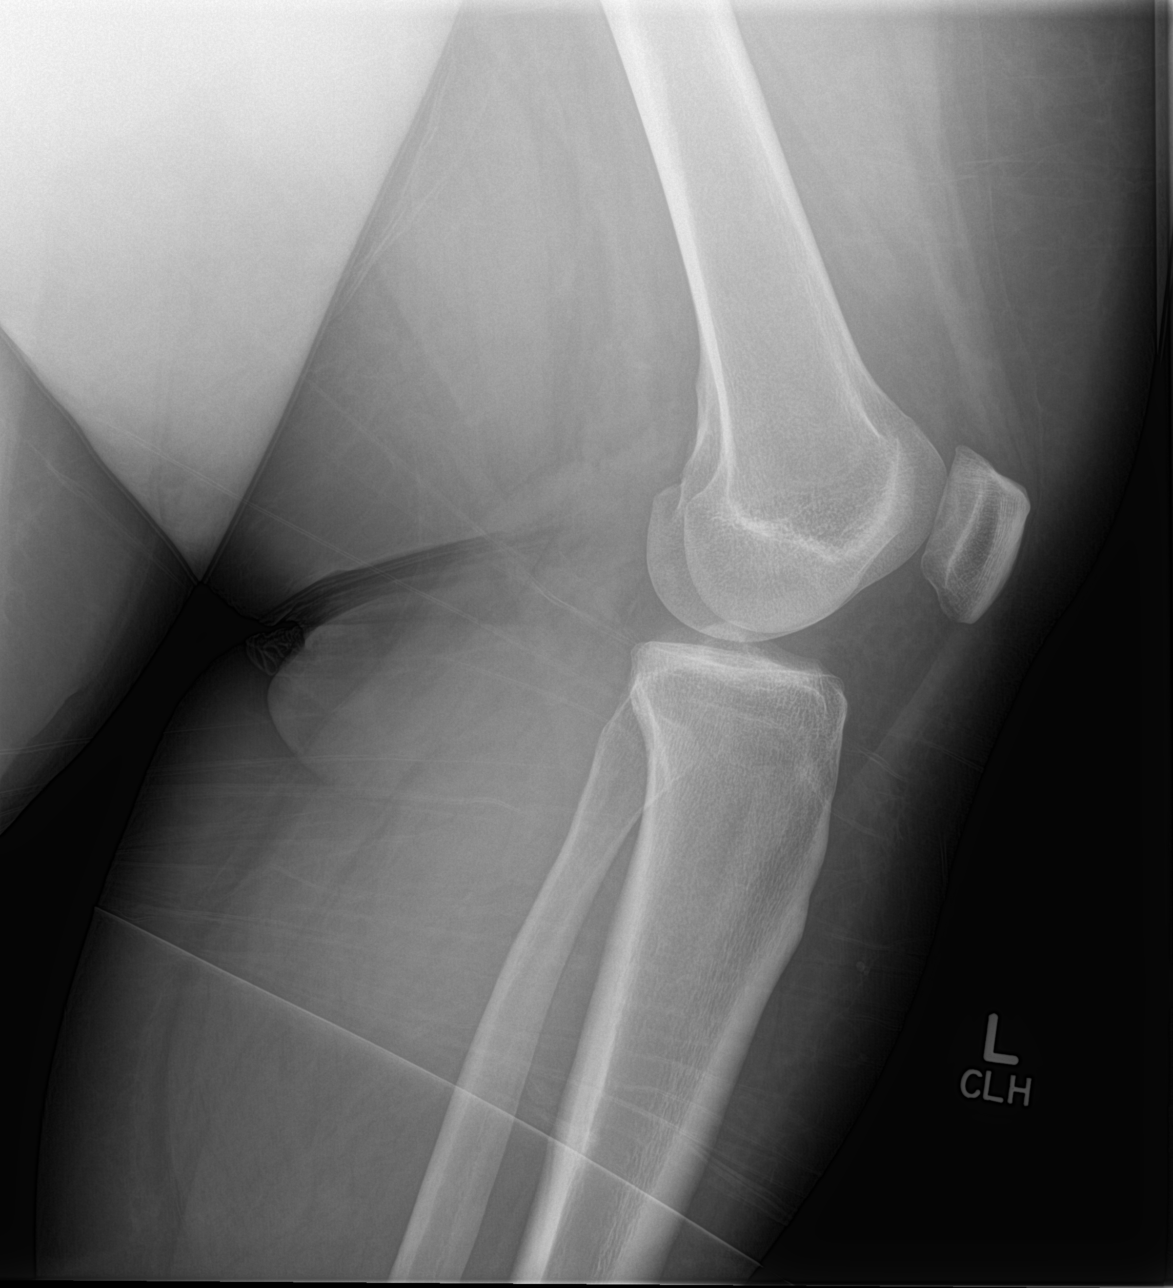

[knee obl (1 of 2)]
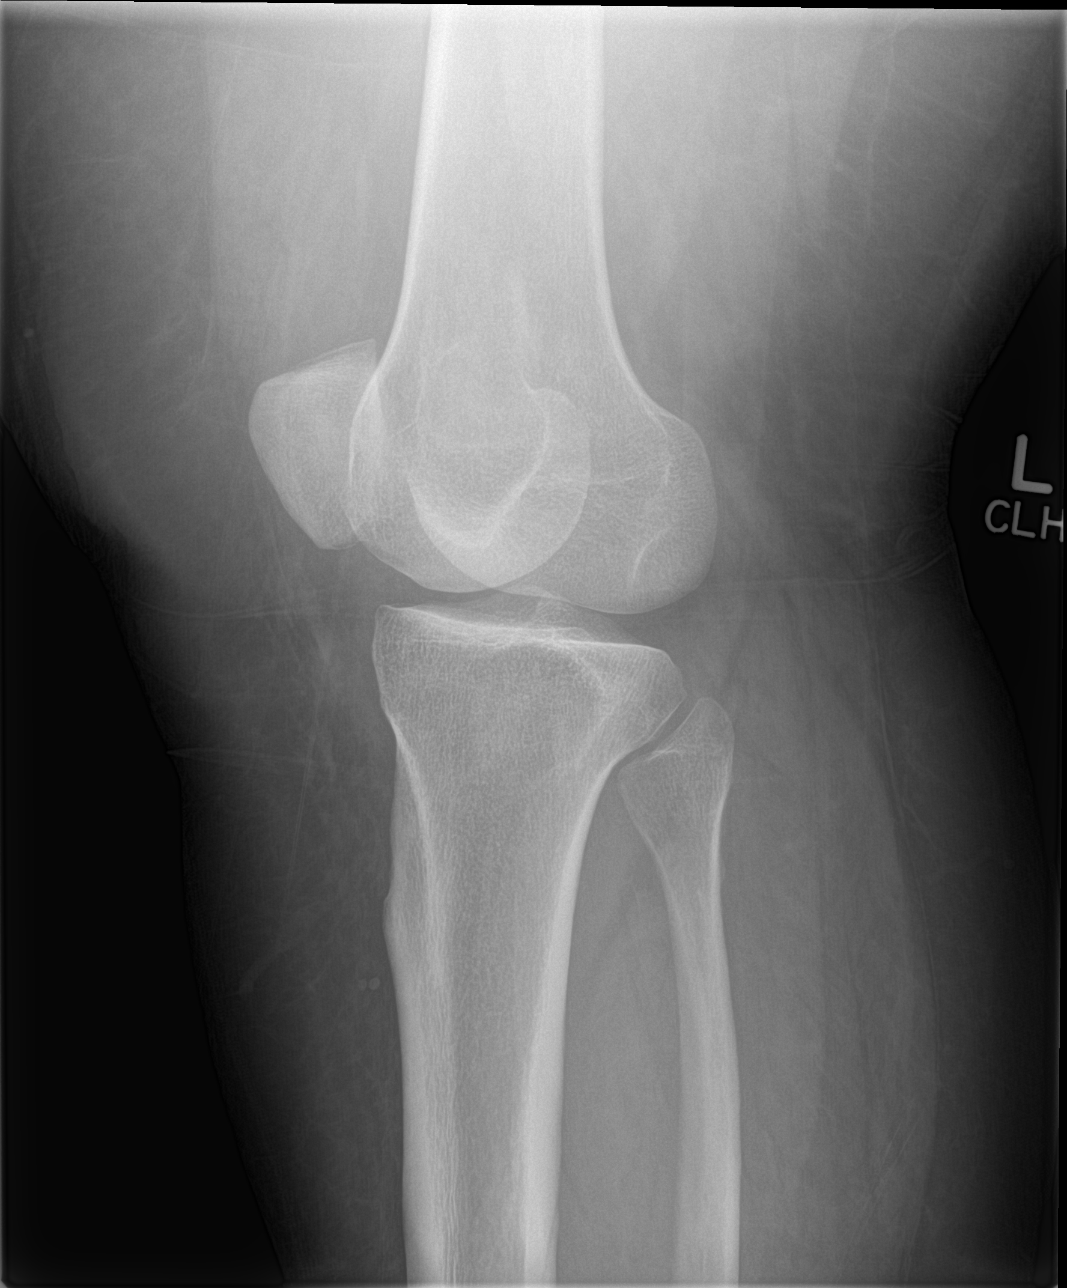

[knee obl (2 of 2)]
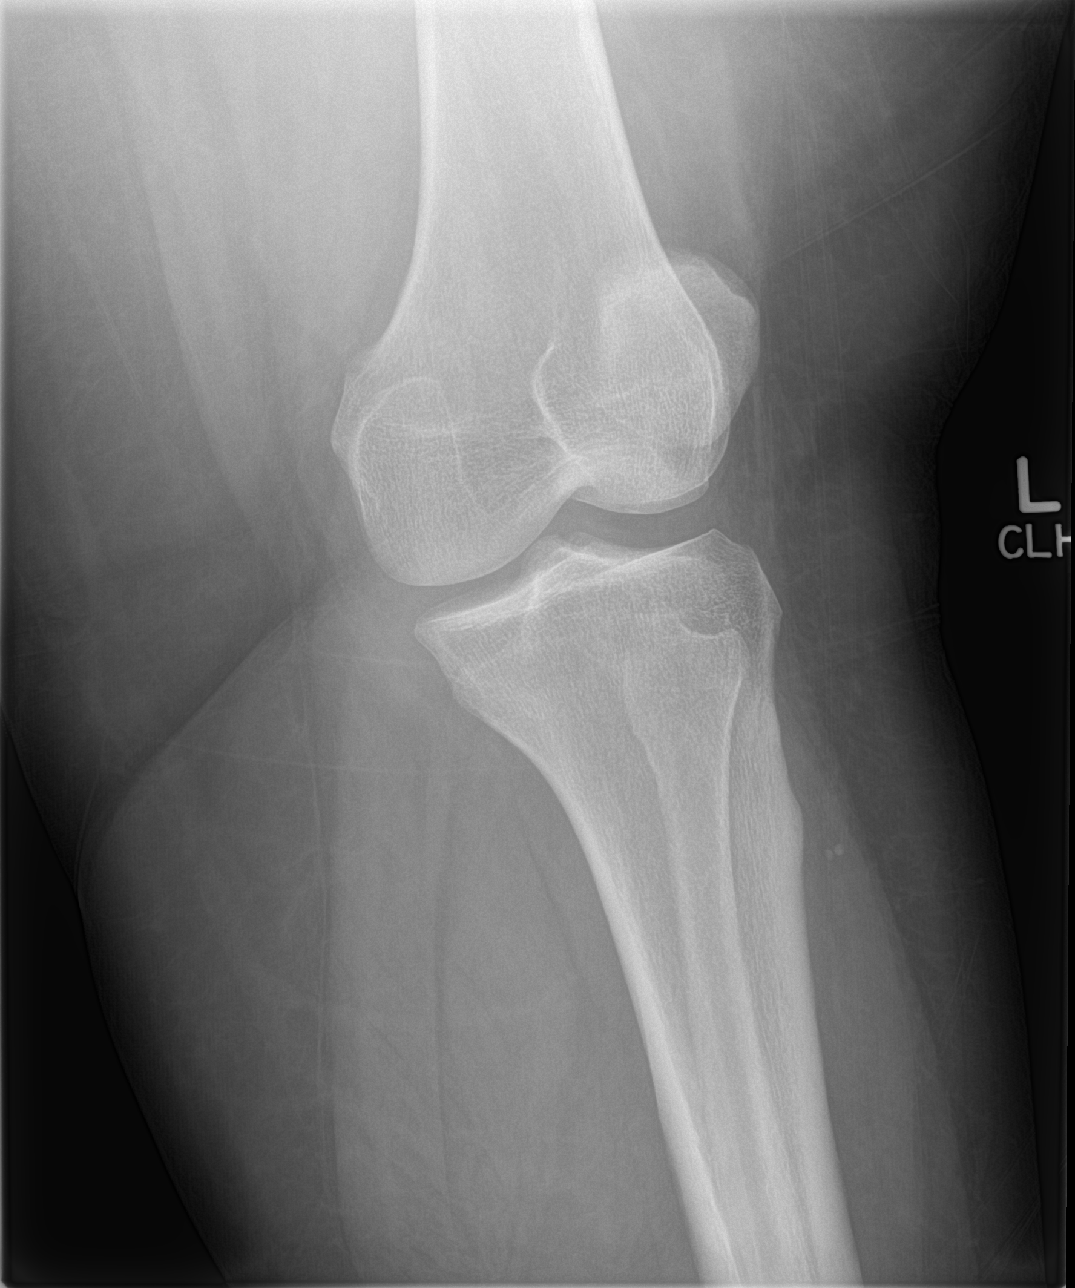

[4 of 4 positions shown; findings below may reference images not displayed]

FINDINGS: There is no evidence of fracture or dislocation. The joint spaces
are preserved. No significant degenerative change is seen; the
patellofemoral joint is grossly unremarkable in appearance.

No significant joint effusion is seen. The visualized soft tissues
are normal in appearance.
IMPRESSION: No evidence of fracture or dislocation.

## 2017-11-08 ENCOUNTER — Emergency Department (HOSPITAL_COMMUNITY)
Admission: EM | Admit: 2017-11-08 | Discharge: 2017-11-08 | Disposition: A | Discharge status: Home / Self Care | Payer: Self-pay | Attending: Emergency Medicine | Admitting: Emergency Medicine

## 2017-11-08 ENCOUNTER — Encounter (HOSPITAL_COMMUNITY): Payer: Self-pay

## 2017-11-08 ENCOUNTER — Emergency Department (HOSPITAL_COMMUNITY): Payer: Self-pay

## 2017-11-08 ENCOUNTER — Other Ambulatory Visit: Payer: Self-pay

## 2017-11-08 DIAGNOSIS — G40909 Epilepsy, unspecified, not intractable, without status epilepticus: Secondary | ICD-10-CM | POA: Insufficient documentation

## 2017-11-08 DIAGNOSIS — Z87891 Personal history of nicotine dependence: Secondary | ICD-10-CM | POA: Insufficient documentation

## 2017-11-08 DIAGNOSIS — I10 Essential (primary) hypertension: Secondary | ICD-10-CM | POA: Insufficient documentation

## 2017-11-08 DIAGNOSIS — R569 Unspecified convulsions: Secondary | ICD-10-CM

## 2017-11-08 LAB — COMPREHENSIVE METABOLIC PANEL
ALT: 17 U/L (ref 0–44)
AST: 19 U/L (ref 15–41)
Albumin: 3.3 g/dL — ABNORMAL LOW (ref 3.5–5.0)
Alkaline Phosphatase: 54 U/L (ref 38–126)
Anion gap: 9 (ref 5–15)
BUN: 5 mg/dL — ABNORMAL LOW (ref 6–20)
CHLORIDE: 106 mmol/L (ref 98–111)
CO2: 23 mmol/L (ref 22–32)
Calcium: 8.5 mg/dL — ABNORMAL LOW (ref 8.9–10.3)
Creatinine, Ser: 0.92 mg/dL (ref 0.44–1.00)
GFR calc Af Amer: >60 mL/min (ref >60)
GLUCOSE: 108 mg/dL — AB (ref 70–99)
POTASSIUM: 3.3 mmol/L — AB (ref 3.5–5.1)
SODIUM: 138 mmol/L (ref 135–145)
TOTAL PROTEIN: 6.4 g/dL — AB (ref 6.5–8.1)
Total Bilirubin: 0.5 mg/dL (ref 0.3–1.2)

## 2017-11-08 LAB — CBC
HCT: 36.4 % (ref 36.0–46.0)
Hemoglobin: 12.2 g/dL (ref 12.0–15.0)
MCH: 26.9 pg (ref 26.0–34.0)
MCHC: 33.5 g/dL (ref 30.0–36.0)
MCV: 80.2 fL (ref 78.0–100.0)
PLATELETS: 228 10*3/uL (ref 150–400)
RBC: 4.54 MIL/uL (ref 3.87–5.11)
RDW: 12.8 % (ref 11.5–15.5)
WBC: 7.7 10*3/uL (ref 4.0–10.5)

## 2017-11-08 LAB — CBG MONITORING, ED: GLUCOSE-CAPILLARY: 107 mg/dL — AB (ref 70–99)

## 2017-11-08 LAB — I-STAT BETA HCG BLOOD, ED (MC, WL, AP ONLY)

## 2017-11-08 LAB — ETHANOL: Alcohol, Ethyl (B): <10 mg/dL (ref <10)

## 2017-11-08 MED ORDER — PROCHLORPERAZINE EDISYLATE 10 MG/2ML IJ SOLN
10.0000 mg | Freq: Once | INTRAMUSCULAR | Status: AC
  Administered 2017-11-08: 10 mg via INTRAVENOUS
  Filled 2017-11-08: qty 2

## 2017-11-08 MED ORDER — SUMATRIPTAN SUCCINATE 50 MG PO TABS: 50.0000 mg | ORAL_TABLET | ORAL | 0 refills | Status: DC | PRN

## 2017-11-08 MED ORDER — LORAZEPAM 2 MG/ML IJ SOLN
1.0000 mg | Freq: Once | INTRAMUSCULAR | Status: AC
  Administered 2017-11-08: 1 mg via INTRAVENOUS
  Filled 2017-11-08: qty 1

## 2017-11-08 MED ORDER — MORPHINE SULFATE (PF) 4 MG/ML IV SOLN
4.0000 mg | Freq: Once | INTRAVENOUS | Status: AC
Start: 2017-11-08 — End: 2017-11-08
  Administered 2017-11-08: 4 mg via INTRAVENOUS
  Filled 2017-11-08: qty 1

## 2017-11-08 MED ORDER — SODIUM CHLORIDE 0.9 % IV SOLN
INTRAVENOUS | Status: DC
  Administered 2017-11-08: 21:00:00 via INTRAVENOUS

## 2017-11-08 MED ORDER — SODIUM CHLORIDE 0.9 % IV BOLUS
1000.0000 mL | Freq: Once | INTRAVENOUS | Status: AC
  Administered 2017-11-08: 1000 mL via INTRAVENOUS

## 2017-11-08 NOTE — ED Triage Notes (Signed)
Pt bib ems for possible seizure witnessed by family in a grocery store. Pt has hx of seizures when she was 37 years old but none since then. Pt has only been responsive to pain with ems. Pt will moan and twitch her body occasionally.  BP 135/90 HR100 CBG 98

## 2017-11-08 NOTE — ED Notes (Signed)
Patient verbalizes understanding of medications and discharge instructions. No further questions at this time. VSS and patient ambulatory at discharge.   

## 2017-11-08 NOTE — ED Notes (Signed)
ED Provider at bedside. 

## 2017-11-08 NOTE — ED Provider Notes (Signed)
[facility] EMERGENCY DEPARTMENT Provider Note   CSN: [id number] Arrival date & time: 11/08/17  1844     History   Chief Complaint Chief Complaint  Patient presents with  . Seizures    HPI Jenny Mann is a 37 y.o. female.  HPI Patient presented to the emergency room for evaluation of a possible seizure.  According to EMS report the patient was at the grocery store with her family.  She had an episode that they thought was a possible seizure.  Patient does not have a history of seizure disorder other than some type of seizure at age 49.  EMS found that the patient is responsive to pain and will occasionally moan and twitch her body. Past Medical History:  Diagnosis Date  . Abnormal Pap smear   . Anemia September 15 2012   office called her and told her to take Fe  . Hypertension   . PIH (pregnancy induced hypertension)    ?2nd and 3rd pregnancy    Patient Active Problem List   Diagnosis Date Noted  . Anemia, iron deficiency 09/11/2012  . Asymptomatic bacteriuria in pregnancy 09/11/2012  . Chronic hypertension in pregnancy 05/28/2012  . Triplet pregnancy in second trimester 05/13/2012  . Sickle cell trait (HCC) 04/22/2012  . Supervision of high-risk pregnancy 04/15/2012  . Previous cesarean delivery affecting pregnancy, antepartum 04/15/2012  . Hx of preeclampsia, prior pregnancy, currently pregnant 04/15/2012  . 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 CESAREAN SECTION    . CESAREAN SECTION N/A 09/25/2012   Procedure: REPEAT  CESAREAN SECTION/TRIPLETS;  Surgeon: Antionette Char, MD;  Location: [facility];  Service: Obstetrics;  Laterality: N/A;     OB History    Gravida  5   Para  4   Term  3   Preterm  1   AB  1   Living  4     SAB  0   TAB  1   Ectopic  0   Multiple  1   Live Births  4            Home Medications    Prior to Admission medications   Medication Sig  Start Date End Date Taking? Authorizing Provider  SUMAtriptan (IMITREX) 50 MG tablet Take 1 tablet (50 mg total) by mouth every 2 (two) hours as needed for migraine. May repeat once in  2 hours if headache persists or recurs. ( 2 doses per day) 11/08/17   Linwood Dibbles, MD    Family History Family History  Problem Relation Age of Onset  . Diabetes Maternal Grandmother   . Diabetes Paternal Grandmother   . Kidney disease Paternal Grandmother     Social History Social History   Tobacco Use  . Smoking status: Former Games developer  . Smokeless tobacco: Never Used  Substance Use Topics  . Alcohol use: No  . Drug use: No     Allergies   Peanut-containing drug products; Shellfish allergy; Ketorolac; and Tramadol   Review of Systems Review of Systems  Unable to perform ROS: Mental status change     Physical Exam Updated Vital Signs BP 123/67   Pulse 81   Temp 99.2 F (37.3 C) (Oral)   Resp 14   Ht 1.676 m (5\' 6" )   Wt 134.3 kg   SpO2 97%   BMI 47.79 kg/m   Physical Exam  HENT:  Head: Normocephalic and atraumatic.  Right Ear: External ear normal.  Left Ear: External ear normal.  Eyes: Conjunctivae are normal. Right eye exhibits no discharge. Left eye exhibits no discharge. No scleral icterus.  Neck: Neck supple. No tracheal deviation present.  Cardiovascular: Normal rate, regular rhythm and intact distal pulses.  Pulmonary/Chest: Effort normal and breath sounds normal. No stridor. No respiratory distress. She has no wheezes. She has no rales.  Abdominal: Soft. Bowel sounds are normal. She exhibits no distension. There is no tenderness. There is no rebound and no guarding.  Musculoskeletal: She exhibits no edema or tenderness.  Neurological: She is alert. Cranial nerve deficit: no facial droop,  She exhibits normal muscle tone. GCS eye subscore is 2. GCS verbal subscore is 2. GCS motor subscore is 5.  Patient will not open her eyes and answer any questions, when I open the  patient's eyelids she looks away from me, when I hold her hand above her head and let go of her hand she moves it away from dropping on her face, intermittent mild twitches of her body that do not resemble seizure activity  Skin: Skin is warm and dry. No rash noted. She is not diaphoretic.  Psychiatric: She has a normal mood and affect.  Nursing note and vitals reviewed.    ED Treatments / Results  Labs (all labs ordered are listed, but only abnormal results are displayed) Labs Reviewed  COMPREHENSIVE METABOLIC PANEL - Abnormal; Notable for the following components:      Result Value   Potassium 3.3 (*)    Glucose, Bld 108 (*)    BUN 5 (*)    Calcium 8.5 (*)    Total Protein 6.4 (*)    Albumin 3.3 (*)    All other components within normal limits  CBG MONITORING, ED - Abnormal; Notable for the following components:   Glucose-Capillary 107 (*)    All other components within normal limits  CBC  ETHANOL  I-STAT BETA HCG BLOOD, ED ([facility], [facility], [facility] ONLY)    EKG EKG Interpretation  Date/Time:  Saturday November 08 2017 18:53:01 EDT Ventricular Rate:  76 PR Interval:    QRS Duration: 94 QT Interval:  379 QTC Calculation: 427 R Axis:   33 Text Interpretation:  Sinus arrhythmia Consider right atrial enlargement Low voltage, precordial leads Since last tracing rate slower Confirmed by Linwood Dibbles [phone]) on 11/08/2017 7:34:02 PM   Radiology Ct Head Wo Contrast  Result Date: 11/08/2017 CLINICAL DATA:  37 year old female with new onset seizure today. EXAM: CT HEAD WITHOUT CONTRAST TECHNIQUE: Contiguous axial images were obtained from the base of the skull through the vertex without intravenous contrast. COMPARISON:  07/28/2013 FINDINGS: Brain: No evidence of acute infarction, hemorrhage, hydrocephalus, extra-axial collection or mass lesion/mass effect. Vascular: No hyperdense vessel or unexpected calcification. Skull: Normal. Negative for fracture or focal lesion. Sinuses/Orbits: No acute  finding. Other: None. IMPRESSION: Unremarkable noncontrast head CT. Electronically Signed   By: Harmon Pier M.D.   On: 11/08/2017 20:27    Procedures Procedures (including critical care time)  Medications Ordered in ED Medications  sodium chloride 0.9 % bolus 1,000 mL (0 mLs Intravenous Stopped 11/08/17 2125)    And  0.9 %  sodium chloride infusion ( Intravenous New Bag/Given 11/08/17 2040)  LORazepam (ATIVAN) injection 1 mg (1 mg Intravenous Given 11/08/17 1947)  prochlorperazine (COMPAZINE) injection 10 mg (10 mg Intravenous Given 11/08/17 2034)  morphine 4 MG/ML injection 4 mg (4 mg Intravenous Given 11/08/17 2034)     Initial Impression /  Assessment and Plan / ED Course  I have reviewed the triage vital signs and the nursing notes.  Pertinent labs & imaging results that were available during my care of the patient were reviewed by me and considered in my medical decision making (see chart for details).   Patient presented to the emergency room for evaluation of possible seizure.  In the ED the patient's symptoms were more suggestive of non epileptic seizure activity.   Patient also developed a headache consistent with prior migraines.  ED work-up is reassuring.  CT scan is negative.  No signs to suggest acute infection.  Patient's symptoms improved with treatment.  She appears stable for outpatient follow-up with neurology.  Final Clinical Impressions(s) / ED Diagnoses   Final diagnoses:  Seizure-like activity [facility])    ED Discharge Orders         Ordered    SUMAtriptan (IMITREX) 50 MG tablet  Every 2 hours PRN     11/08/17 2202           Linwood Dibbles, MD 11/08/17 2203

## 2017-11-08 NOTE — Discharge Instructions (Signed)
Follow up with a neurologist for further evaluation, take the medications as needed for headache

## 2019-09-19 ENCOUNTER — Ambulatory Visit (HOSPITAL_COMMUNITY)
Admission: EM | Admit: 2019-09-19 | Discharge: 2019-09-19 | Disposition: A | Discharge status: Home / Self Care | Payer: Self-pay | Attending: Urgent Care | Admitting: Urgent Care

## 2019-09-19 ENCOUNTER — Encounter (HOSPITAL_COMMUNITY): Payer: Self-pay

## 2019-09-19 ENCOUNTER — Ambulatory Visit (INDEPENDENT_AMBULATORY_CARE_PROVIDER_SITE_OTHER): Payer: Self-pay

## 2019-09-19 ENCOUNTER — Other Ambulatory Visit: Payer: Self-pay

## 2019-09-19 DIAGNOSIS — R0789 Other chest pain: Secondary | ICD-10-CM

## 2019-09-19 DIAGNOSIS — R079 Chest pain, unspecified: Secondary | ICD-10-CM

## 2019-09-19 DIAGNOSIS — S40021A Contusion of right upper arm, initial encounter: Secondary | ICD-10-CM

## 2019-09-19 DIAGNOSIS — R0781 Pleurodynia: Secondary | ICD-10-CM

## 2019-09-19 DIAGNOSIS — S20212A Contusion of left front wall of thorax, initial encounter: Secondary | ICD-10-CM

## 2019-09-19 DIAGNOSIS — S2020XA Contusion of thorax, unspecified, initial encounter: Secondary | ICD-10-CM

## 2019-09-19 DIAGNOSIS — S40022A Contusion of left upper arm, initial encounter: Secondary | ICD-10-CM

## 2019-09-19 DIAGNOSIS — M79631 Pain in right forearm: Secondary | ICD-10-CM

## 2019-09-19 DIAGNOSIS — S2232XA Fracture of one rib, left side, initial encounter for closed fracture: Secondary | ICD-10-CM

## 2019-09-19 MED ORDER — ACETAMINOPHEN 325 MG PO TABS
ORAL_TABLET | ORAL | Status: AC
  Filled 2019-09-19: qty 2

## 2019-09-19 MED ORDER — TIZANIDINE HCL 4 MG PO TABS: 4.0000 mg | ORAL_TABLET | Freq: Three times a day (TID) | ORAL | 0 refills | Status: AC | PRN

## 2019-09-19 MED ORDER — HYDROCODONE-ACETAMINOPHEN 5-325 MG PO TABS: 1.0000 | ORAL_TABLET | Freq: Four times a day (QID) | ORAL | 0 refills | Status: DC | PRN

## 2019-09-19 MED ORDER — ACETAMINOPHEN 325 MG PO TABS
650.0000 mg | ORAL_TABLET | Freq: Once | ORAL | Status: AC
  Administered 2019-09-19: 650 mg via ORAL
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
## 2019-09-19 NOTE — Discharge Instructions (Addendum)
Please schedule Tylenol at 500 mg - 650 mg once every 6 hours as needed for aches and pains.  If you still have pain despite taking Tylenol regularly, this is breakthrough pain.  You can use hydrocodone once every 6 hours for this.  Once your pain is better controlled, switch back to just Tylenol.  It is okay to use tizanidine with this as well.  This is a muscle relaxant and can help you better through the pain of your rib fracture.  You can can follow-up in 4 to 6 weeks to follow the progression of your rib fracture.  However, if you develop worsening chest pain, difficulty with your breathing or swelling then please come back for recheck.
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
## 2020-01-16 ENCOUNTER — Ambulatory Visit (INDEPENDENT_AMBULATORY_CARE_PROVIDER_SITE_OTHER): Payer: Medicaid Other

## 2020-01-16 ENCOUNTER — Ambulatory Visit (HOSPITAL_COMMUNITY): Payer: Medicaid Other

## 2020-01-16 ENCOUNTER — Other Ambulatory Visit: Payer: Self-pay

## 2020-01-16 ENCOUNTER — Ambulatory Visit (HOSPITAL_COMMUNITY)
Admission: EM | Admit: 2020-01-16 | Discharge: 2020-01-16 | Disposition: A | Discharge status: Home / Self Care | Payer: Medicaid Other | Attending: Emergency Medicine | Admitting: Emergency Medicine

## 2020-01-16 ENCOUNTER — Encounter (HOSPITAL_COMMUNITY): Payer: Self-pay

## 2020-01-16 DIAGNOSIS — M79642 Pain in left hand: Secondary | ICD-10-CM

## 2020-01-16 DIAGNOSIS — S6992XA Unspecified injury of left wrist, hand and finger(s), initial encounter: Secondary | ICD-10-CM

## 2020-01-16 DIAGNOSIS — W19XXXA Unspecified fall, initial encounter: Secondary | ICD-10-CM

## 2020-01-16 DIAGNOSIS — S63502A Unspecified sprain of left wrist, initial encounter: Secondary | ICD-10-CM

## 2020-01-16 MED ORDER — IBUPROFEN 800 MG PO TABS
800.0000 mg | ORAL_TABLET | Freq: Three times a day (TID) | ORAL | 0 refills | Status: AC
Start: 2020-01-16 — End: ?

## 2020-01-16 NOTE — ED Triage Notes (Signed)
Pt present a fall last night and injuring her left hand/wrist. Pt states she got tangled while walking her dog and instead of stepping on her dog she fall and injured her hand/wrist

## 2020-01-16 NOTE — Discharge Instructions (Signed)
Xray without any fractures Wear wrist brace to limit movement of wrist/hand Ice and elevate Use anti-inflammatories for pain/swelling. You may take up to 800 mg Ibuprofen every 8 hours with food. You may supplement Ibuprofen with Tylenol [id number] mg every 8 hours.  Follow up if not improving over the next 1-2  weeks

## 2020-01-16 NOTE — ED Provider Notes (Signed)
[facility]    CSN: [id number] Arrival date & time: 01/16/20  1032      History   Chief Complaint Chief Complaint  Patient presents with  . Hand Injury  . Wrist Injury    HPI Jenny Mann is a 39 y.o. female history of hypertension presenting today for evaluation of left hand and wrist injury.  Patient reports that her dog's leash got wrapped around her feet causing her to trip and fall, caught fall with her left hand.  Reports waking up with significant pain and swelling throughout hand, pain shoots up into her arm.  Reports mild wrist pain.  Denies any prior injury or fracture.  Denies hitting head or loss of consciousness.  Patient is right-handed.  HPI  Past Medical History:  Diagnosis Date  . Abnormal Pap smear   . Anemia September 15 2012   office called her and told her to take Fe  . Hypertension   . PIH (pregnancy induced hypertension)    ?2nd and 3rd pregnancy    Patient Active Problem List   Diagnosis Date Noted  . Anemia, iron deficiency 09/11/2012  . Asymptomatic bacteriuria in pregnancy 09/11/2012  . Chronic hypertension in pregnancy 05/28/2012  . Triplet pregnancy in second trimester 05/13/2012  . Sickle cell trait (HCC) 04/22/2012  . Supervision of high-risk pregnancy 04/15/2012  . Previous cesarean delivery affecting pregnancy, antepartum 04/15/2012  . Hx of preeclampsia, prior pregnancy, currently pregnant 04/15/2012  . 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 CESAREAN SECTION    . CESAREAN SECTION N/A 09/25/2012   Procedure: REPEAT  CESAREAN SECTION/TRIPLETS;  Surgeon: Antionette Char, MD;  Location: [facility];  Service: Obstetrics;  Laterality: N/A;    OB History    Gravida  5   Para  4   Term  3   Preterm  1   AB  1   Living  4     SAB  0   TAB  1   Ectopic  0   Multiple  1   Live Births  4            Home Medications    Prior to Admission medications    Medication Sig Start Date End Date Taking? Authorizing Provider  HYDROcodone-acetaminophen (NORCO/VICODIN) 5-325 MG tablet Take 1 tablet by mouth every 6 (six) hours as needed for severe pain. 09/19/19   Wallis Bamberg, PA-C  ibuprofen (ADVIL) 800 MG tablet Take 1 tablet (800 mg total) by mouth 3 (three) times daily. 01/16/20   Jakylan Ron C, PA-C  tiZANidine (ZANAFLEX) 4 MG tablet Take 1 tablet (4 mg total) by mouth every 8 (eight) hours as needed. 09/19/19   Wallis Bamberg, PA-C    Family History Family History  Problem Relation Age of Onset  . Diabetes Maternal Grandmother   . Diabetes Paternal Grandmother   . Kidney disease Paternal Grandmother     Social History Social History   Tobacco Use  . Smoking status: Former Games developer  . Smokeless tobacco: Never Used  Substance Use Topics  . Alcohol use: No  . Drug use: No     Allergies   Peanut-containing drug products, Shellfish allergy, Ketorolac, and Tramadol   Review of Systems Review of Systems  Constitutional: Negative for fatigue and fever.  Eyes: Negative for visual disturbance.  Respiratory: Negative for shortness of breath.   Cardiovascular: Negative for chest pain.  Gastrointestinal: Negative for  abdominal pain, nausea and vomiting.  Musculoskeletal: Positive for arthralgias and joint swelling.  Skin: Negative for color change, rash and wound.  Neurological: Negative for dizziness, weakness, light-headedness and headaches.     Physical Exam Triage Vital Signs ED Triage Vitals [01/16/20 1057]  Enc Vitals Group     BP 118/75     Pulse Rate 70     Resp 18     Temp 98.9 F (37.2 C)     Temp Source Oral     SpO2 99 %     Weight      Height      Head Circumference      Peak Flow      Pain Score      Pain Loc      Pain Edu?      Excl. in GC?    No data found.  Updated Vital Signs BP 118/75 (BP Location: Right Arm)   Pulse 70   Temp 98.9 F (37.2 C) (Oral)   Resp 18   SpO2 99%   Visual  Acuity Right Eye Distance:   Left Eye Distance:   Bilateral Distance:    Right Eye Near:   Left Eye Near:    Bilateral Near:     Physical Exam Vitals and nursing note reviewed.  Constitutional:      Appearance: She is well-developed.     Comments: No acute distress  HENT:     Head: Normocephalic and atraumatic.     Nose: Nose normal.  Eyes:     Conjunctiva/sclera: Conjunctivae normal.  Cardiovascular:     Rate and Rhythm: Normal rate.  Pulmonary:     Effort: Pulmonary effort is normal. No respiratory distress.  Abdominal:     General: There is no distension.  Musculoskeletal:        General: Normal range of motion.     Cervical back: Neck supple.     Comments: Left hand/wrist: Mild swelling noted throughout dorsum of hand, no obvious deformity or discoloration, nontender to palpation of distal ulna, tender to palpation of distal radius extending throughout entire dorsum of hand, most prominent over third metatarsal, mild snuffbox tenderness Cap refill less than 2 seconds, sensation intact distally, slightly decreased through palmar surface on second through fourth fingers  Skin:    General: Skin is warm and dry.  Neurological:     Mental Status: She is alert and oriented to person, place, and time.      UC Treatments / Results  Labs (all labs ordered are listed, but only abnormal results are displayed) Labs Reviewed - No data to display  EKG   Radiology DG Hand Complete Left  Result Date: 01/16/2020 CLINICAL DATA:  Pain following fall EXAM: LEFT HAND - COMPLETE 3+ VIEW COMPARISON:  None. FINDINGS: Frontal, oblique, and lateral views were obtained. There is no appreciable fracture or dislocation. Joint spaces appear normal. No erosive change. 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 IMPRESSION: No fracture or dislocation. No appreciable arthropathy. 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 Electronically Signed   By: Bretta Bang III M.D.   On: 01/16/2020 12:12    Procedures Procedures (including critical care time)  Medications Ordered in UC Medications - No data to display  Initial Impression / Assessment and Plan / UC Course  I have reviewed the triage vital signs and the nursing notes.  Pertinent labs & imaging results that were available during my care of the patient  were reviewed by me and considered in my medical decision making (see chart for details).     X-ray negative for acute fracture, does have some mild snuffbox tenderness, will place in thumb spica, treating as sprain and recommending rest ice and anti-inflammatories.  Follow-up in 1 to 2 weeks if not having any improvement.  Discussed strict return precautions. Patient verbalized understanding and is agreeable with plan.  Final Clinical Impressions(s) / UC Diagnoses   Final diagnoses:  Injury of left hand, initial encounter  Sprain of left wrist, initial encounter     Discharge Instructions     Xray without any fractures Wear wrist brace to limit movement of wrist/hand Ice and elevate Use anti-inflammatories for pain/swelling. You may take up to 800 mg Ibuprofen every 8 hours with food. You may supplement Ibuprofen with Tylenol [phone] mg every 8 hours.  Follow up if not improving over the next 1-2  weeks    ED Prescriptions    Medication Sig Dispense Auth. Provider   ibuprofen (ADVIL) 800 MG tablet Take 1 tablet (800 mg total) by mouth 3 (three) times daily. 21 tablet Daison Braxton, Reasnor C, PA-C     PDMP not reviewed this encounter.   Viktor Philipp, [place] C, PA-C 01/16/20 1225

## 2020-11-10 ENCOUNTER — Encounter (HOSPITAL_BASED_OUTPATIENT_CLINIC_OR_DEPARTMENT_OTHER): Payer: Self-pay

## 2020-11-10 ENCOUNTER — Other Ambulatory Visit: Payer: Self-pay

## 2020-11-10 ENCOUNTER — Emergency Department (HOSPITAL_BASED_OUTPATIENT_CLINIC_OR_DEPARTMENT_OTHER)
Admission: EM | Admit: 2020-11-10 | Discharge: 2020-11-10 | Disposition: A | Discharge status: Home / Self Care | Payer: Self-pay | Attending: Emergency Medicine | Admitting: Emergency Medicine

## 2020-11-10 ENCOUNTER — Emergency Department (HOSPITAL_BASED_OUTPATIENT_CLINIC_OR_DEPARTMENT_OTHER): Payer: Self-pay

## 2020-11-10 DIAGNOSIS — M791 Myalgia, unspecified site: Secondary | ICD-10-CM | POA: Insufficient documentation

## 2020-11-10 DIAGNOSIS — Z91010 Allergy to peanuts: Secondary | ICD-10-CM | POA: Insufficient documentation

## 2020-11-10 DIAGNOSIS — Z87891 Personal history of nicotine dependence: Secondary | ICD-10-CM | POA: Insufficient documentation

## 2020-11-10 DIAGNOSIS — J1089 Influenza due to other identified influenza virus with other manifestations: Secondary | ICD-10-CM | POA: Insufficient documentation

## 2020-11-10 DIAGNOSIS — R0602 Shortness of breath: Secondary | ICD-10-CM | POA: Insufficient documentation

## 2020-11-10 DIAGNOSIS — J069 Acute upper respiratory infection, unspecified: Secondary | ICD-10-CM | POA: Insufficient documentation

## 2020-11-10 DIAGNOSIS — R6883 Chills (without fever): Secondary | ICD-10-CM | POA: Insufficient documentation

## 2020-11-10 DIAGNOSIS — J45901 Unspecified asthma with (acute) exacerbation: Secondary | ICD-10-CM | POA: Insufficient documentation

## 2020-11-10 DIAGNOSIS — Z20822 Contact with and (suspected) exposure to covid-19: Secondary | ICD-10-CM | POA: Insufficient documentation

## 2020-11-10 LAB — RESP PANEL BY RT-PCR (FLU A&B, COVID) ARPGX2
Influenza A by PCR: NEGATIVE
Influenza B by PCR: NEGATIVE
SARS Coronavirus 2 by RT PCR: NEGATIVE

## 2020-11-10 MED ORDER — BENZONATATE 100 MG PO CAPS
100.0000 mg | ORAL_CAPSULE | Freq: Three times a day (TID) | ORAL | 0 refills | Status: AC
Start: 2020-11-10 — End: ?

## 2020-11-10 MED ORDER — PREDNISONE 50 MG PO TABS
60.0000 mg | ORAL_TABLET | Freq: Once | ORAL | Status: AC
  Administered 2020-11-10: 60 mg via ORAL
  Filled 2020-11-10: qty 1

## 2020-11-10 MED ORDER — IPRATROPIUM-ALBUTEROL 0.5-2.5 (3) MG/3ML IN SOLN
3.0000 mL | Freq: Once | RESPIRATORY_TRACT | Status: AC
  Administered 2020-11-10: 3 mL via RESPIRATORY_TRACT
  Filled 2020-11-10: qty 3

## 2020-11-10 MED ORDER — ALBUTEROL SULFATE HFA 108 (90 BASE) MCG/ACT IN AERS
1.0000 | INHALATION_SPRAY | Freq: Once | RESPIRATORY_TRACT | Status: AC
  Administered 2020-11-10: 1 via RESPIRATORY_TRACT
  Filled 2020-11-10: qty 6.7

## 2020-11-10 MED ORDER — PREDNISONE 10 MG PO TABS
40.0000 mg | ORAL_TABLET | Freq: Every day | ORAL | 0 refills | Status: AC
Start: 2020-11-10 — End: 2020-11-14

## 2020-11-10 NOTE — Discharge Instructions (Addendum)
Take the steroids beginning tomorrow. Use the inhaler as needed. Return to the ER if you start to experience worsening symptoms, trouble breathing, trouble swallowing, severe chest pain or leg swelling.

## 2020-11-10 NOTE — ED Triage Notes (Signed)
Pt c/o flu like sx started yesterday-states she had a neg covid test at work yesterday-NAD-to triage in w/c-was seen by RT in ED WR prior to triage

## 2020-11-10 NOTE — ED Notes (Signed)
Pt A&Ox4 ambulatory at d/c with independent steady gait.  

## 2020-11-10 NOTE — ED Notes (Signed)
Seen before triage, BBS decreased t/o, Spo2 99%, HR 106, RR 22, congested cough.  Not currently on any bronchdilators for her asthma.

## 2020-11-10 NOTE — ED Provider Notes (Signed)
[facility] EMERGENCY DEPARTMENT Provider Note   CSN: [id number] Arrival date & time: 11/10/20  1507     History Chief Complaint  Patient presents with   Cough    Jenny Mann is a 40 y.o. female presenting to the ED with a chief complaint of wheezing, cough, shortness of breath.  Symptoms began yesterday and worsened today.  She has been taking DayQuil and NyQuil with only minimal improvement in her symptoms.  Reports myalgias and cold chills.  No sick contacts with similar symptoms.  Had a negative COVID test at work yesterday.  She does not have any inhalers at home and she is concerned that this is flaring up her asthma.  Reports chest pain and back pain that is worse when she coughs.  Denies any leg swelling, abdominal pain, vomiting.   Cough Associated symptoms: chills, myalgias, shortness of breath and wheezing   Associated symptoms: no chest pain, no ear pain, no fever, no rash, no rhinorrhea and no sore throat       Past Medical History:  Diagnosis Date   Abnormal Pap smear    Anemia September 15 2012   office called her and told her to take Fe   Hypertension    PIH (pregnancy induced hypertension)    ?2nd and 3rd pregnancy    Patient Active Problem List   Diagnosis Date Noted   Anemia, iron deficiency 09/11/2012   Asymptomatic bacteriuria in pregnancy 09/11/2012   Chronic hypertension in pregnancy 05/28/2012   Triplet pregnancy in second trimester 05/13/2012   Sickle cell trait (HCC) 04/22/2012   Supervision of high-risk pregnancy 04/15/2012   Previous cesarean delivery affecting pregnancy, antepartum 04/15/2012   Hx of preeclampsia, prior pregnancy, currently pregnant 04/15/2012   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    Past Surgical History:  Procedure Laterality Date   CESAREAN SECTION     CESAREAN SECTION N/A 09/25/2012   Procedure: REPEAT  CESAREAN SECTION/TRIPLETS;  Surgeon: Antionette Char, MD;  Location: [facility];  Service: Obstetrics;   Laterality: N/A;     OB History     Gravida  5   Para  4   Term  3   Preterm  1   AB  1   Living  4      SAB  0   IAB  1   Ectopic  0   Multiple  1   Live Births  4           Family History  Problem Relation Age of Onset   Diabetes Maternal Grandmother    Diabetes Paternal Grandmother    Kidney disease Paternal Grandmother     Social History   Tobacco Use   Smoking status: Former   Smokeless tobacco: Never  Building services engineer Use: Never used  Substance Use Topics   Alcohol use: No   Drug use: No    Home Medications Prior to Admission medications   Medication Sig Start Date End Date Taking? Authorizing Provider  benzonatate (TESSALON) 100 MG capsule Take 1 capsule (100 mg total) by mouth every 8 (eight) hours. 11/10/20  Yes Sherrilynn Gudgel, PA-C  predniSONE (DELTASONE) 10 MG tablet Take 4 tablets (40 mg total) by mouth daily for 4 days. 11/10/20 11/14/20 Yes Anarosa Kubisiak, PA-C  HYDROcodone-acetaminophen (NORCO/VICODIN) 5-325 MG tablet Take 1 tablet by mouth every 6 (six) hours as needed for severe pain. 09/19/19   Wallis Bamberg, PA-C  ibuprofen (ADVIL) 800 MG tablet Take  1 tablet (800 mg total) by mouth 3 (three) times daily. 01/16/20   Wieters, Hallie C, PA-C  tiZANidine (ZANAFLEX) 4 MG tablet Take 1 tablet (4 mg total) by mouth every 8 (eight) hours as needed. 09/19/19   Wallis Bamberg, PA-C    Allergies    Peanut-containing drug products, Shellfish allergy, Ketorolac, and Tramadol  Review of Systems   Review of Systems  Constitutional:  Positive for chills. Negative for appetite change and fever.  HENT:  Negative for ear pain, rhinorrhea, sneezing and sore throat.   Eyes:  Negative for photophobia and visual disturbance.  Respiratory:  Positive for cough, chest tightness, shortness of breath and wheezing.   Cardiovascular:  Negative for chest pain and palpitations.  Gastrointestinal:  Negative for abdominal pain, blood in stool, constipation, diarrhea,  nausea and vomiting.  Genitourinary:  Negative for dysuria, hematuria and urgency.  Musculoskeletal:  Positive for myalgias.  Skin:  Negative for rash.  Neurological:  Negative for dizziness, weakness and light-headedness.   Physical Exam Updated Vital Signs BP (!) 134/104   Pulse 87   Temp 99.3 F (37.4 C) (Oral)   Resp 13   Ht 5\' 6"  (1.676 m)   Wt 130.2 kg   LMP 11/01/2020   SpO2 95%   BMI 46.32 kg/m   Physical Exam Vitals and nursing note reviewed.  Constitutional:      General: She is not in acute distress.    Appearance: She is well-developed.  HENT:     Head: Normocephalic and atraumatic.     Nose: Nose normal.  Eyes:     General: No scleral icterus.       Left eye: No discharge.     Conjunctiva/sclera: Conjunctivae normal.  Cardiovascular:     Rate and Rhythm: Normal rate and regular rhythm.     Heart sounds: Normal heart sounds. No murmur heard.   No friction rub. No gallop.  Pulmonary:     Effort: Pulmonary effort is normal. No respiratory distress.     Breath sounds: Wheezing (Bilateral, end expiratory) present.  Abdominal:     General: Bowel sounds are normal. There is no distension.     Palpations: Abdomen is soft.     Tenderness: There is no abdominal tenderness. There is no guarding.  Musculoskeletal:        General: Normal range of motion.     Cervical back: Normal range of motion and neck supple.  Skin:    General: Skin is warm and dry.     Findings: No rash.  Neurological:     Mental Status: She is alert.     Motor: No abnormal muscle tone.     Coordination: Coordination normal.    ED Results / Procedures / Treatments   Labs (all labs ordered are listed, but only abnormal results are displayed) Labs Reviewed  RESP PANEL BY RT-PCR (FLU A&B, COVID) [id number]    EKG None  Radiology DG Chest 2 View  Result Date: 11/10/2020 CLINICAL DATA:  Dyspnea EXAM: CHEST - 2 VIEW COMPARISON:  09/19/2019 FINDINGS: q the lungs are symmetrically well  expanded. A 15 mm nodular opacity has developed within the left mid lung zone peripherally, likely reflecting callus related to the acute left rib fracture noted on prior examination. The lungs are otherwise clear. No pneumothorax or pleural effusion. Cardiac size within normal limits. Pulmonary vascularity is normal. Osseous structures are age-appropriate. No acute bone abnormality. IMPRESSION: 15 mm nodular density within the left mid lung zone laterally  likely related to remote left rib fracture. No radiographic evidence of acute cardiopulmonary disease. Electronically Signed   By: Helyn Numbers M.D.   On: 11/10/2020 20:24    Procedures Procedures   Medications Ordered in ED Medications  albuterol (VENTOLIN HFA) 108 (90 Base) MCG/ACT inhaler 1 puff (has no administration in time range)  ipratropium-albuterol (DUONEB) 0.5-2.5 (3) MG/3ML nebulizer solution 3 mL (3 mLs Nebulization Given 11/10/20 2017)  predniSONE (DELTASONE) tablet 60 mg (60 mg Oral Given 11/10/20 2017)    ED Course  I have reviewed the triage vital signs and the nursing notes.  Pertinent labs & imaging results that were available during my care of the patient were reviewed by me and considered in my medical decision making (see chart for details).    MDM Rules/Calculators/A&P                           40 year old female with past medical history of asthma presenting to the ED for cough, shortness of breath, wheezing, myalgias, chest tightness since yesterday but worsened today.  Negative COVID test yesterday at work.  Minimal improvement noted with DayQuil and NyQuil.  Does not have an inhaler at home to help with her asthma.  On exam there is bilateral end expiratory wheezing.  Oxygen saturations are 94% on room air.  Her COVID test is negative.  We will proceed with EKG, chest x-ray and attempt treatment with DuoNeb and prednisone.  On recheck patient reports significant improvement in her symptoms after DuoNeb and  prednisone.  Repeat lung exam shows improvement in her wheezing.  Oxygen saturations remain above 95% on room air.  EKG shows sinus rhythm, no ischemic changes, no STEMI.  Chest x-ray shows possible callus formation related to a remote left rib fracture but no other concerning findings.  No acute findings.  Suspect that symptoms are viral in nature.  This is most likely causing exacerbation of her asthma.  We will have her continue steroids at home.  Give her albuterol to take home as well.  Given antitussive.  Return precautions given.  Patient is hemodynamically stable, in NAD, and able to ambulate in the ED. Evaluation does not show pathology that would require ongoing emergent intervention or inpatient treatment. I explained the diagnosis to the patient. Pain has been managed and has no complaints prior to discharge. Patient is comfortable with above plan and is stable for discharge at this time. All questions were answered prior to disposition. Strict return precautions for returning to the ED were discussed. Encouraged follow up with PCP.   An After Visit Summary was printed and given to the patient.   Portions of this note were generated with Scientist, clinical (histocompatibility and immunogenetics). Dictation errors may occur despite best attempts at proofreading.  Final Clinical Impression(s) / ED Diagnoses Final diagnoses:  Viral URI with cough  Exacerbation of asthma, unspecified asthma severity, unspecified whether persistent    Rx / DC Orders ED Discharge Orders          Ordered    predniSONE (DELTASONE) 10 MG tablet  Daily        11/10/20 2045    benzonatate (TESSALON) 100 MG capsule  Every 8 hours        11/10/20 2045             Dietrich Pates, PA-C 11/10/20 2047    Rolan Bucco, MD 11/10/20 2313

## 2022-01-15 ENCOUNTER — Ambulatory Visit (HOSPITAL_COMMUNITY)
Admission: EM | Admit: 2022-01-15 | Discharge: 2022-01-15 | Disposition: A | Discharge status: Home / Self Care | Payer: Commercial Managed Care - HMO | Attending: Emergency Medicine | Admitting: Emergency Medicine

## 2022-01-15 ENCOUNTER — Encounter (HOSPITAL_COMMUNITY): Payer: Self-pay

## 2022-01-15 DIAGNOSIS — B349 Viral infection, unspecified: Secondary | ICD-10-CM | POA: Diagnosis not present

## 2022-01-15 DIAGNOSIS — J45901 Unspecified asthma with (acute) exacerbation: Secondary | ICD-10-CM | POA: Diagnosis not present

## 2022-01-15 MED ORDER — ONDANSETRON 4 MG PO TBDP
ORAL_TABLET | ORAL | Status: AC
  Filled 2022-01-15: qty 1

## 2022-01-15 MED ORDER — IPRATROPIUM-ALBUTEROL 0.5-2.5 (3) MG/3ML IN SOLN
3.0000 mL | Freq: Once | RESPIRATORY_TRACT | Status: AC
  Administered 2022-01-15: 3 mL via RESPIRATORY_TRACT

## 2022-01-15 MED ORDER — PREDNISONE 20 MG PO TABS: 40.0000 mg | ORAL_TABLET | Freq: Every day | ORAL | 0 refills | Status: AC

## 2022-01-15 MED ORDER — ALBUTEROL SULFATE HFA 108 (90 BASE) MCG/ACT IN AERS: 2.0000 | INHALATION_SPRAY | Freq: Four times a day (QID) | RESPIRATORY_TRACT | 2 refills | Status: DC | PRN

## 2022-01-15 MED ORDER — ALBUTEROL SULFATE (2.5 MG/3ML) 0.083% IN NEBU
INHALATION_SOLUTION | RESPIRATORY_TRACT | Status: AC
  Filled 2022-01-15: qty 3

## 2022-01-15 MED ORDER — ALBUTEROL SULFATE (2.5 MG/3ML) 0.083% IN NEBU
2.5000 mg | INHALATION_SOLUTION | Freq: Once | RESPIRATORY_TRACT | Status: AC
  Administered 2022-01-15: 2.5 mg via RESPIRATORY_TRACT

## 2022-01-15 MED ORDER — METHYLPREDNISOLONE SODIUM SUCC 125 MG IJ SOLR
INTRAMUSCULAR | Status: AC
  Filled 2022-01-15: qty 2

## 2022-01-15 MED ORDER — METHYLPREDNISOLONE SODIUM SUCC 125 MG IJ SOLR
60.0000 mg | Freq: Once | INTRAMUSCULAR | Status: AC
  Administered 2022-01-15: 60 mg via INTRAMUSCULAR

## 2022-01-15 MED ORDER — ONDANSETRON 4 MG PO TBDP
4.0000 mg | ORAL_TABLET | Freq: Once | ORAL | Status: AC
  Administered 2022-01-15: 4 mg via ORAL

## 2022-01-15 MED ORDER — IPRATROPIUM-ALBUTEROL 0.5-2.5 (3) MG/3ML IN SOLN
RESPIRATORY_TRACT | Status: AC
  Filled 2022-01-15: qty 3

## 2022-01-15 NOTE — ED Provider Notes (Signed)
[facility]    CSN: [id number] Arrival date & time: 01/15/22  1540     History   Chief Complaint Chief Complaint  Patient presents with   Shortness of Breath    HPI Jenny Mann is a 41 y.o. female.  Presents with shortness of breath, trouble breathing, chest tightness, cough Symptoms began last night, worsened this morning History of asthma but she has not needed or used an inhaler in over a year  On initial presentation in triage she has audible wheezing with increased work of breath, tachypneic DuoNeb given Patient with chills in clinic. Temp 98.2  Covid test negative at work yesterday   Past Medical History:  Diagnosis Date   Abnormal Pap smear    Anemia September 15 2012   office called her and told her to take Fe   Hypertension    PIH (pregnancy induced hypertension)    ?2nd and 3rd pregnancy    Patient Active Problem List   Diagnosis Date Noted   Anemia, iron deficiency 09/11/2012   Asymptomatic bacteriuria in pregnancy 09/11/2012   Chronic hypertension in pregnancy 05/28/2012   Triplet pregnancy in second trimester 05/13/2012   Sickle cell trait (HCC) 04/22/2012   Supervision of high-risk pregnancy 04/15/2012   Previous cesarean delivery affecting pregnancy, antepartum 04/15/2012   Hx of preeclampsia, prior pregnancy, currently pregnant 04/15/2012   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 Authorizing Provider   albuterol (VENTOLIN HFA) 108 (90 Base) MCG/ACT inhaler Inhale 2 puffs into the lungs every 6 (six) hours as needed for wheezing or shortness of breath. 01/15/22  Yes Azya Barbero, Lurena Joiner, PA-C  predniSONE (DELTASONE) 20 MG tablet Take 2 tablets (40 mg total) by mouth daily for 5 days. 01/15/22 01/20/22 Yes Aicia Babinski, Lurena Joiner, PA-C  benzonatate (TESSALON) 100 MG capsule Take 1 capsule (100 mg total) by mouth every 8 (eight) hours. 11/10/20   Khatri, Hina, PA-C  ibuprofen (ADVIL) 800 MG tablet Take 1 tablet (800 mg total) by mouth 3 (three) times daily. 01/16/20   Wieters, Hallie C, PA-C  tiZANidine (ZANAFLEX) 4 MG tablet Take 1 tablet (4 mg total) by mouth every 8 (eight) hours as needed. 09/19/19   Wallis Bamberg, PA-C    Family History Family History  Problem Relation Age of Onset   Diabetes Maternal Grandmother    Diabetes Paternal Grandmother    Kidney disease Paternal Grandmother     Social History Social History   Tobacco Use   Smoking status: Former   Smokeless tobacco: Never  Building services engineer Use: Never used  Substance Use Topics   Alcohol use: No   Drug use: No     Allergies  Peanut-containing drug products, Shellfish allergy, Ketorolac, and Tramadol   Review of Systems Review of Systems  Respiratory:  Positive for shortness of breath.    Per HPI  Physical Exam Triage Vital Signs ED Triage Vitals  Enc Vitals Group     BP 01/15/22 0913 (!) 148/110     Pulse Rate 01/15/22 0913 87     Resp 01/15/22 0913 20     Temp 01/15/22 0913 98.1 F (36.7 C)     Temp Source 01/15/22 0913 Oral     SpO2 01/15/22 0913 98 %     Weight --      Height --      Head Circumference --      Peak Flow --      Pain Score 01/15/22 0915 0     Pain Loc --      Pain Edu? --      Excl. in GC? --    No data found.  Updated Vital Signs BP (!) 148/110 (BP Location: Right Arm)   Pulse 87   Temp 98.1 F (36.7 C) (Oral)   Resp 20   LMP  (LMP Unknown)   SpO2 98%   Physical Exam Vitals  and nursing note reviewed.  Constitutional:      General: She is not in acute distress.    Appearance: She is not diaphoretic.     Comments: Exam documented after first DuoNeb treatment.  HENT:     Nose: No congestion.     Mouth/Throat:     Mouth: Mucous membranes are moist.     Pharynx: Oropharynx is clear. No posterior oropharyngeal erythema.  Eyes:     Conjunctiva/sclera: Conjunctivae normal.  Cardiovascular:     Rate and Rhythm: Normal rate and regular rhythm.     Pulses: Normal pulses.     Heart sounds: Normal heart sounds.  Pulmonary:     Effort: Pulmonary effort is normal.     Breath sounds: Wheezing present.  Musculoskeletal:        General: Normal range of motion.     Cervical back: Normal range of motion.  Lymphadenopathy:     Cervical: No cervical adenopathy.  Skin:    General: Skin is warm and dry.  Neurological:     Mental Status: She is alert and oriented to person, place, and time.     UC Treatments / Results  Labs (all labs ordered are listed, but only abnormal results are displayed) Labs Reviewed - No data to display  EKG   Radiology No results found.  Procedures Procedures (including critical care time)  Medications Ordered in UC Medications  ipratropium-albuterol (DUONEB) 0.5-2.5 (3) MG/3ML nebulizer solution 3 mL (3 mLs Nebulization Given 01/15/22 0918)  albuterol (PROVENTIL) (2.5 MG/3ML) 0.083% nebulizer solution 2.5 mg (2.5 mg Nebulization Given 01/15/22 1002)  methylPREDNISolone sodium succinate (SOLU-MEDROL) 125 mg/2 mL injection 60 mg (60 mg Intramuscular Given 01/15/22 1001)  ondansetron (ZOFRAN-ODT) disintegrating tablet 4 mg (4 mg Oral Given 01/15/22 1044)    Initial Impression / Assessment and Plan / UC Course  I have reviewed the triage vital signs and the nursing notes.  Pertinent labs & imaging results that were available during my care of the patient were reviewed by me and considered in my medical decision making (see chart for  details).  Satting 98% throughout visit.  Afebrile. After initial DuoNeb, work of breathing has normalized, wheezing still present but patient no longer in respiratory distress. She does have chills. Discussed next treatment  options including plain albuterol neb and/or IM steroid, patient would like to proceed  Albut neb and IM solumedrol 60 mg given. Improvement in chest tightness and wheezing.  Patient complaining of nausea. Zofran 4mg  ODT given. Nausea resolved.  Prednisone 40 mg daily for the next 5 days to prevent another exacerbation Sent albuterol inhaler, instructed to use every 4-6 hours for the next day or so, and then as needed Strict ED precautions.  UC return precautions discussed. Patient agrees to plan  Final Clinical Impressions(s) / UC Diagnoses   Final diagnoses:  Exacerbation of asthma, unspecified asthma severity, unspecified whether persistent  Viral illness     Discharge Instructions      Use the inhaler every 4-6 hours for the next two days. Then use as needed.  Take the prednisone 40 mg daily for the next 5 days  Please monitor symptoms and return if they persist. Go to the ED if they worsen.     ED Prescriptions     Medication Sig Dispense Auth. Provider   albuterol (VENTOLIN HFA) 108 (90 Base) MCG/ACT inhaler Inhale 2 puffs into the lungs every 6 (six) hours as needed for wheezing or shortness of breath. 18 g Arina Torry, PA-C   predniSONE (DELTASONE) 20 MG tablet Take 2 tablets (40 mg total) by mouth daily for 5 days. 10 tablet Andris Brothers, Wells Guiles, PA-C      PDMP not reviewed this encounter.   Marleta Lapierre, Wells Guiles, PA-C 01/15/22 1146

## 2022-01-15 NOTE — Discharge Instructions (Addendum)
Use the inhaler every 4-6 hours for the next two days. Then use as needed.  Take the prednisone 40 mg daily for the next 5 days  Please monitor symptoms and return if they persist. Go to the ED if they worsen.

## 2022-01-15 NOTE — ED Triage Notes (Signed)
Pt is here for SOB, chest feels tight  since last night

## 2022-01-15 NOTE — ED Notes (Signed)
Unable to scan pt wrist band pt was seen in a emergency .

## 2023-07-04 DIAGNOSIS — Z7952 Long term (current) use of systemic steroids: Secondary | ICD-10-CM | POA: Diagnosis not present

## 2023-07-04 DIAGNOSIS — J4521 Mild intermittent asthma with (acute) exacerbation: Secondary | ICD-10-CM | POA: Insufficient documentation

## 2023-07-04 DIAGNOSIS — R0602 Shortness of breath: Secondary | ICD-10-CM | POA: Diagnosis present

## 2023-07-04 DIAGNOSIS — Z91010 Allergy to peanuts: Secondary | ICD-10-CM | POA: Insufficient documentation

## 2023-07-05 ENCOUNTER — Emergency Department (HOSPITAL_BASED_OUTPATIENT_CLINIC_OR_DEPARTMENT_OTHER)

## 2023-07-05 ENCOUNTER — Emergency Department (HOSPITAL_BASED_OUTPATIENT_CLINIC_OR_DEPARTMENT_OTHER)
Admission: EM | Admit: 2023-07-05 | Discharge: 2023-07-05 | Disposition: A | Discharge status: Home / Self Care | Attending: Emergency Medicine | Admitting: Emergency Medicine

## 2023-07-05 ENCOUNTER — Other Ambulatory Visit: Payer: Self-pay

## 2023-07-05 ENCOUNTER — Encounter (HOSPITAL_BASED_OUTPATIENT_CLINIC_OR_DEPARTMENT_OTHER): Payer: Self-pay | Admitting: Emergency Medicine

## 2023-07-05 DIAGNOSIS — J4521 Mild intermittent asthma with (acute) exacerbation: Secondary | ICD-10-CM | POA: Diagnosis not present

## 2023-07-05 LAB — CBC
HCT: 34.2 % — ABNORMAL LOW (ref 36.0–46.0)
Hemoglobin: 11.7 g/dL — ABNORMAL LOW (ref 12.0–15.0)
MCH: 26.5 pg (ref 26.0–34.0)
MCHC: 34.2 g/dL (ref 30.0–36.0)
MCV: 77.4 fL — ABNORMAL LOW (ref 80.0–100.0)
Platelets: 277 10*3/uL (ref 150–400)
RBC: 4.42 MIL/uL (ref 3.87–5.11)
RDW: 13.7 % (ref 11.5–15.5)
WBC: 9.2 10*3/uL (ref 4.0–10.5)
nRBC: 0 % (ref 0.0–0.2)

## 2023-07-05 LAB — BASIC METABOLIC PANEL WITH GFR
Anion gap: 14 (ref 5–15)
BUN: 8 mg/dL (ref 6–20)
CO2: 21 mmol/L — ABNORMAL LOW (ref 22–32)
Calcium: 8.9 mg/dL (ref 8.9–10.3)
Chloride: 105 mmol/L (ref 98–111)
Creatinine, Ser: 0.96 mg/dL (ref 0.44–1.00)
GFR, Estimated: >60 mL/min (ref >60)
Glucose, Bld: 127 mg/dL — ABNORMAL HIGH (ref 70–99)
Potassium: 3.5 mmol/L (ref 3.5–5.1)
Sodium: 139 mmol/L (ref 135–145)

## 2023-07-05 MED ORDER — ALBUTEROL SULFATE (2.5 MG/3ML) 0.083% IN NEBU
INHALATION_SOLUTION | RESPIRATORY_TRACT | Status: AC
  Administered 2023-07-05: 5 mg via RESPIRATORY_TRACT
  Filled 2023-07-05: qty 6

## 2023-07-05 MED ORDER — ALBUTEROL SULFATE HFA 108 (90 BASE) MCG/ACT IN AERS: 2.0000 | INHALATION_SPRAY | RESPIRATORY_TRACT | Status: DC | PRN

## 2023-07-05 MED ORDER — ALBUTEROL SULFATE (2.5 MG/3ML) 0.083% IN NEBU: 5.0000 mg | INHALATION_SOLUTION | Freq: Once | RESPIRATORY_TRACT | Status: AC

## 2023-07-05 MED ORDER — METHYLPREDNISOLONE SODIUM SUCC 125 MG IJ SOLR
125.0000 mg | Freq: Once | INTRAMUSCULAR | Status: AC
  Administered 2023-07-05: 125 mg via INTRAVENOUS
  Filled 2023-07-05: qty 2

## 2023-07-05 MED ORDER — PREDNISONE 10 MG PO TABS: 40.0000 mg | ORAL_TABLET | Freq: Every day | ORAL | 0 refills | Status: AC

## 2023-07-05 MED ORDER — ALBUTEROL SULFATE HFA 108 (90 BASE) MCG/ACT IN AERS: 2.0000 | INHALATION_SPRAY | Freq: Four times a day (QID) | RESPIRATORY_TRACT | 0 refills | Status: AC | PRN

## 2023-07-05 MED ORDER — ALBUTEROL SULFATE HFA 108 (90 BASE) MCG/ACT IN AERS
INHALATION_SPRAY | RESPIRATORY_TRACT | Status: AC
  Administered 2023-07-05: 2 via RESPIRATORY_TRACT
  Filled 2023-07-05: qty 6.7

## 2023-07-05 MED ORDER — ALBUTEROL SULFATE (2.5 MG/3ML) 0.083% IN NEBU
5.0000 mg | INHALATION_SOLUTION | Freq: Once | RESPIRATORY_TRACT | Status: AC
  Administered 2023-07-05: 5 mg via RESPIRATORY_TRACT
  Filled 2023-07-05: qty 6

## 2023-07-05 NOTE — ED Triage Notes (Signed)
 Pt states was at work and developed SOB, Hx of asthma, has not had a exacerbation in a long time and does not currently have any home meds.

## 2023-07-05 NOTE — ED Provider Notes (Signed)
 [place] [facility] Provider Note   CSN: [id number] Arrival date & time: 07/04/23  2359     History  Chief Complaint  Patient presents with   Shortness of Breath    Jenny Mann is a 43 y.o. female.  The history is provided by the patient.  Shortness of Breath Jenny Mann is a 43 y.o. female who presents to the Emergency Department complaining of difficulty breathing.  She presents to the emergency department for evaluation of difficulty breathing that started just prior to ED arrival.  She was at work and felt hot and short of breath.  She had associated chest tightness.  She did not have her inhaler and presented to the emergency department for evaluation.  She received a nebulizer treatment in triage, which significantly improved her symptoms.  No associated fever, itching.  She does have a history of asthma but has not had medications for a year.  No additional medical problems.      Home Medications Prior to Admission medications   Medication Sig Start Date End Date Taking? Authorizing Provider  albuterol  (VENTOLIN  HFA) 108 (90 Base) MCG/ACT inhaler Inhale 2 puffs into the lungs every 6 (six) hours as needed for wheezing or shortness of breath. 07/05/23  Yes Kelsey Patricia, MD  predniSONE  (DELTASONE ) 10 MG tablet Take 4 tablets (40 mg total) by mouth daily. 07/05/23  Yes Kelsey Patricia, MD  benzonatate  (TESSALON ) 100 MG capsule Take 1 capsule (100 mg total) by mouth every 8 (eight) hours. 11/10/20   Khatri, Hina, PA-C  ibuprofen  (ADVIL ) 800 MG tablet Take 1 tablet (800 mg total) by mouth 3 (three) times daily. 01/16/20   Wieters, Hallie C, PA-C  tiZANidine  (ZANAFLEX ) 4 MG tablet Take 1 tablet (4 mg total) by mouth every 8 (eight) hours as needed. 09/19/19   Adolph Hoop, PA-C      Allergies    Peanut-containing drug products, Shellfish allergy, Ketorolac , and Tramadol    Review of Systems   Review of Systems  Respiratory:  Positive for  shortness of breath.   All other systems reviewed and are negative.   Physical Exam Updated Vital Signs BP 121/84 (BP Location: Left Arm)   Pulse 87   Temp 98.7 F (37.1 C) (Oral)   Resp 13   Ht 5\' 5"  (1.651 m)   Wt (!) 175.5 kg   LMP 06/24/2023 (Approximate)   SpO2 97%   BMI 64.40 kg/m  Physical Exam Vitals and nursing note reviewed.  Constitutional:      Appearance: She is well-developed.  HENT:     Head: Normocephalic and atraumatic.  Cardiovascular:     Rate and Rhythm: Normal rate and regular rhythm.     Heart sounds: No murmur heard. Pulmonary:     Effort: Pulmonary effort is normal. No respiratory distress.     Breath sounds: Normal breath sounds.  Abdominal:     Palpations: Abdomen is soft.     Tenderness: There is no abdominal tenderness. There is no guarding or rebound.  Musculoskeletal:        General: No tenderness.     Comments: Mild nonpitting edema to ble  Skin:    General: Skin is warm and dry.  Neurological:     Mental Status: She is alert and oriented to person, place, and time.  Psychiatric:        Behavior: Behavior normal.     ED Results / Procedures / Treatments   Labs (all  labs ordered are listed, but only abnormal results are displayed) Labs Reviewed  BASIC METABOLIC PANEL WITH GFR - Abnormal; Notable for the following components:      Result Value   CO2 21 (*)    Glucose, Bld 127 (*)    All other components within normal limits  CBC - Abnormal; Notable for the following components:   Hemoglobin 11.7 (*)    HCT 34.2 (*)    MCV 77.4 (*)    All other components within normal limits  PREGNANCY, URINE    EKG None  Radiology DG Chest 2 View Result Date: 07/05/2023 CLINICAL DATA:  Shortness of breath EXAM: CHEST - 2 VIEW COMPARISON:  11/10/2020 FINDINGS: The heart size and mediastinal contours are within normal limits. Both lungs are clear. The visualized skeletal structures are unremarkable. IMPRESSION: No active cardiopulmonary  disease. Electronically Signed   By: Violeta Grey M.D.   On: 07/05/2023 01:23    Procedures Procedures    Medications Ordered in ED Medications  albuterol  (VENTOLIN  HFA) 108 (90 Base) MCG/ACT inhaler 2 puff (2 puffs Inhalation Given 07/05/23 0018)  albuterol  (PROVENTIL ) (2.5 MG/3ML) 0.083% nebulizer solution 5 mg (5 mg Nebulization Given 07/05/23 0017)  albuterol  (PROVENTIL ) (2.5 MG/3ML) 0.083% nebulizer solution 5 mg (5 mg Nebulization Given 07/05/23 0049)  methylPREDNISolone  sodium succinate (SOLU-MEDROL ) 125 mg/2 mL injection 125 mg (125 mg Intravenous Given 07/05/23 0132)    ED Course/ Medical Decision Making/ A&P                                 Medical Decision Making Amount and/or Complexity of Data Reviewed Labs: ordered. Radiology: ordered.  Risk Prescription drug management.   Patient with history of asthma here for evaluation of shortness of breath.  She did have wheezing at time of ED arrival per respiratory, resolved with albuterol  administration.  Current clinical picture is not consistent with PE, acute CHF, pneumonia.  Will treat with Solu-Medrol , prednisone  for asthma exacerbation.  Discussed outpatient follow-up as well as return precautions.        Final Clinical Impression(s) / ED Diagnoses Final diagnoses:  Mild intermittent asthma with exacerbation    Rx / DC Orders ED Discharge Orders          Ordered    albuterol  (VENTOLIN  HFA) 108 (90 Base) MCG/ACT inhaler  Every 6 hours PRN        07/05/23 0200    predniSONE  (DELTASONE ) 10 MG tablet  Daily        07/05/23 0200              Kelsey Patricia, MD 07/05/23 [phone]

## 2023-07-24 ENCOUNTER — Emergency Department (HOSPITAL_BASED_OUTPATIENT_CLINIC_OR_DEPARTMENT_OTHER)

## 2023-07-24 ENCOUNTER — Emergency Department (HOSPITAL_BASED_OUTPATIENT_CLINIC_OR_DEPARTMENT_OTHER)
Admission: EM | Admit: 2023-07-24 | Discharge: 2023-07-24 | Disposition: A | Discharge status: Home / Self Care | Attending: Emergency Medicine | Admitting: Emergency Medicine

## 2023-07-24 ENCOUNTER — Other Ambulatory Visit: Payer: Self-pay

## 2023-07-24 ENCOUNTER — Encounter (HOSPITAL_BASED_OUTPATIENT_CLINIC_OR_DEPARTMENT_OTHER): Payer: Self-pay | Admitting: Emergency Medicine

## 2023-07-24 DIAGNOSIS — Z91010 Allergy to peanuts: Secondary | ICD-10-CM | POA: Diagnosis not present

## 2023-07-24 DIAGNOSIS — M1711 Unilateral primary osteoarthritis, right knee: Secondary | ICD-10-CM | POA: Diagnosis not present

## 2023-07-24 DIAGNOSIS — M25561 Pain in right knee: Secondary | ICD-10-CM | POA: Diagnosis present

## 2023-07-24 LAB — CBC WITH DIFFERENTIAL/PLATELET
Abs Immature Granulocytes: 0.02 10*3/uL (ref 0.00–0.07)
Basophils Absolute: 0 10*3/uL (ref 0.0–0.1)
Basophils Relative: 0 %
Eosinophils Absolute: 0.1 10*3/uL (ref 0.0–0.5)
Eosinophils Relative: 2 %
HCT: 34.9 % — ABNORMAL LOW (ref 36.0–46.0)
Hemoglobin: 11.8 g/dL — ABNORMAL LOW (ref 12.0–15.0)
Immature Granulocytes: 0 %
Lymphocytes Relative: 29 %
Lymphs Abs: 2.3 10*3/uL (ref 0.7–4.0)
MCH: 26.4 pg (ref 26.0–34.0)
MCHC: 33.8 g/dL (ref 30.0–36.0)
MCV: 78.1 fL — ABNORMAL LOW (ref 80.0–100.0)
Monocytes Absolute: 0.8 10*3/uL (ref 0.1–1.0)
Monocytes Relative: 10 %
Neutro Abs: 4.5 10*3/uL (ref 1.7–7.7)
Neutrophils Relative %: 59 %
Platelets: 253 10*3/uL (ref 150–400)
RBC: 4.47 MIL/uL (ref 3.87–5.11)
RDW: 14.1 % (ref 11.5–15.5)
WBC: 7.8 10*3/uL (ref 4.0–10.5)
nRBC: 0 % (ref 0.0–0.2)

## 2023-07-24 LAB — COMPREHENSIVE METABOLIC PANEL WITH GFR
ALT: 16 U/L (ref 0–44)
AST: 18 U/L (ref 15–41)
Albumin: 3.8 g/dL (ref 3.5–5.0)
Alkaline Phosphatase: 64 U/L (ref 38–126)
Anion gap: 10 (ref 5–15)
BUN: 7 mg/dL (ref 6–20)
CO2: 25 mmol/L (ref 22–32)
Calcium: 8.7 mg/dL — ABNORMAL LOW (ref 8.9–10.3)
Chloride: 104 mmol/L (ref 98–111)
Creatinine, Ser: 0.82 mg/dL (ref 0.44–1.00)
GFR, Estimated: >60 mL/min (ref >60)
Glucose, Bld: 90 mg/dL (ref 70–99)
Potassium: 3.5 mmol/L (ref 3.5–5.1)
Sodium: 139 mmol/L (ref 135–145)
Total Bilirubin: 0.5 mg/dL (ref 0.0–1.2)
Total Protein: 6.8 g/dL (ref 6.5–8.1)

## 2023-07-24 LAB — HCG, SERUM, QUALITATIVE: Preg, Serum: NEGATIVE

## 2023-07-24 MED ORDER — ACETAMINOPHEN 500 MG PO TABS: 1000.0000 mg | ORAL_TABLET | Freq: Once | ORAL | Status: DC

## 2023-07-24 NOTE — Discharge Instructions (Signed)
 Your knee pain appears to be from arthritis.  Follow-up with the orthopedist for further outpatient management of this.  You can wrap your knee as well as apply ice and elevation.  If you develop fever, redness to the joint, new or worsening swelling, uncontrolled pain, or any other new/concerning symptoms then return to the ER.

## 2023-07-24 NOTE — ED Provider Notes (Signed)
 [place] [facility] Provider Note   CSN: [id number] Arrival date & time: 07/24/23  0865     History  Chief Complaint  Patient presents with   Leg Pain    Jenny Mann is a 43 y.o. female.  HPI 43 year old female presents with right leg pain and swelling.  Patient states that she took a 12-hour drive by herself 3 months ago.  When she got back from her trip she noticed that her right knee was hurting and has been ever since.  She can walk but it is painful.  No redness or fevers.  About a week ago she started noticing swelling in her right leg, starting in her foot and now seems to go up to her knee anteriorly.  No calf pain.  Over the last couple days she is also noticed some left foot pain but not in her lower leg.  No chest pain or shortness of breath.  Home Medications Prior to Admission medications   Medication Sig Start Date End Date Taking? Authorizing Provider  albuterol  (VENTOLIN  HFA) 108 (90 Base) MCG/ACT inhaler Inhale 2 puffs into the lungs every 6 (six) hours as needed for wheezing or shortness of breath. 07/05/23   Kelsey Patricia, MD  benzonatate  (TESSALON ) 100 MG capsule Take 1 capsule (100 mg total) by mouth every 8 (eight) hours. 11/10/20   Khatri, Hina, PA-C  ibuprofen  (ADVIL ) 800 MG tablet Take 1 tablet (800 mg total) by mouth 3 (three) times daily. 01/16/20   Wieters, Hallie C, PA-C  predniSONE  (DELTASONE ) 10 MG tablet Take 4 tablets (40 mg total) by mouth daily. 07/05/23   Kelsey Patricia, MD  tiZANidine  (ZANAFLEX ) 4 MG tablet Take 1 tablet (4 mg total) by mouth every 8 (eight) hours as needed. 09/19/19   Adolph Hoop, PA-C      Allergies    Peanut-containing drug products, Shellfish allergy, Ketorolac , and Tramadol    Review of Systems   Review of Systems  Constitutional:  Negative for fever.  Respiratory:  Negative for shortness of breath.   Cardiovascular:  Positive for leg swelling. Negative for chest pain.   Musculoskeletal:  Positive for arthralgias.  Skin:  Negative for color change.    Physical Exam Updated Vital Signs BP (!) 140/77   Pulse 64   Temp 97.7 F (36.5 C)   Resp 18   Ht 5\' 4"  (1.626 m)   Wt (!) 175.1 kg   LMP 07/19/2023 (Approximate)   SpO2 100%   BMI 66.26 kg/m  Physical Exam Vitals and nursing note reviewed.  Constitutional:      Appearance: She is well-developed. She is obese.  HENT:     Head: Normocephalic and atraumatic.  Cardiovascular:     Rate and Rhythm: Normal rate and regular rhythm.     Pulses:          Dorsalis pedis pulses are 2+ on the right side and 2+ on the left side.  Pulmonary:     Effort: Pulmonary effort is normal.  Musculoskeletal:     Right knee: No erythema. Normal range of motion. Tenderness present.     Right lower leg: Tenderness present.     Right ankle: Swelling present.     Left ankle: Swelling present.     Right foot: Swelling present.     Left foot: Swelling present.     Comments: There is some bilateral feet swelling and swelling of the ankles.  I do not appreciate a significant  difference in her lower legs but she reports the right leg is swollen compared to the left and is tender in the anterior aspect.  Skin:    General: Skin is warm and dry.  Neurological:     Mental Status: She is alert.     ED Results / Procedures / Treatments   Labs (all labs ordered are listed, but only abnormal results are displayed) Labs Reviewed  COMPREHENSIVE METABOLIC PANEL WITH GFR - Abnormal; Notable for the following components:      Result Value   Calcium  8.7 (*)    All other components within normal limits  CBC WITH DIFFERENTIAL/PLATELET - Abnormal; Notable for the following components:   Hemoglobin 11.8 (*)    HCT 34.9 (*)    MCV 78.1 (*)    All other components within normal limits  HCG, SERUM, QUALITATIVE    EKG None  Radiology DG Knee Complete 4 Views Right Result Date: 07/24/2023 CLINICAL DATA:  Right knee pain.  3  months. EXAM: RIGHT KNEE - COMPLETE 4+ VIEW COMPARISON:  None Available. FINDINGS: Mild medial compartment joint space narrowing and peripheral osteophytosis. Mild superior patellar degenerative spurring. Likely mild patellofemoral joint space narrowing superior patellar subchondral lucencies, likely degenerative subchondral geodes. Mild to moderate joint effusion. No acute fracture is seen. No dislocation. IMPRESSION: 1. Mild medial and patellofemoral compartment osteoarthritis. 2. Mild to moderate joint effusion. Electronically Signed   By: Bertina Broccoli M.D.   On: 07/24/2023 12:33   US  Venous Img Lower Unilateral Right Result Date: 07/24/2023 CLINICAL DATA:  Right leg pain EXAM: RIGHT LOWER EXTREMITY VENOUS DOPPLER ULTRASOUND TECHNIQUE: Gray-scale sonography with compression, as well as color and duplex ultrasound, were performed to evaluate the deep venous system(s) from the level of the common femoral vein through the popliteal and proximal calf veins. COMPARISON:  None Available. FINDINGS: VENOUS Normal compressibility of the common femoral, superficial femoral, and popliteal veins, as well as the visualized calf veins. Visualized portions of profunda femoral vein and great saphenous vein unremarkable. No filling defects to suggest DVT on grayscale or color Doppler imaging. Doppler waveforms show normal direction of venous flow, normal respiratory plasticity and response to augmentation. Limited views of the contralateral common femoral vein are unremarkable. OTHER None. Limitations: none IMPRESSION: Negative. Electronically Signed   By: Fernando Hoyer M.D.   On: 07/24/2023 10:49    Procedures Procedures    Medications Ordered in ED Medications  acetaminophen  (TYLENOL ) tablet 1,000 mg (1,000 mg Oral Not Given 07/24/23 1311)    ED Course/ Medical Decision Making/ A&P                                 Medical Decision Making Amount and/or Complexity of Data Reviewed Labs: ordered.     Details: Unremarkable kidney function and liver function Radiology: ordered and independent interpretation performed.    Details: No knee fracture or DVT  Risk OTC drugs.   Patient presents with leg swelling.  No evidence of infection such as cellulitis.  DVT study and knee x-ray are overall unremarkable besides some arthritis in her knee and small joint effusion.  Based on exam with normal range of motion, no erythema, increased warmth, fever, etc., I have a low suspicion for a septic joint.  I think this is likely more of an osteoarthritis situation.  Recommended Tylenol  as it does not appear that she is capable of taking NSAIDs given a ketorolac  allergy.  Will have her follow-up with Ortho.  Otherwise, patient appears stable for discharge home with return precautions.        Final Clinical Impression(s) / ED Diagnoses Final diagnoses:  Arthritis of right knee    Rx / DC Orders ED Discharge Orders     None         Jerilynn Montenegro, MD 07/24/23 1420

## 2023-07-24 NOTE — ED Triage Notes (Addendum)
 Right leg/knee pain for about 3 months.  Pt states she took a long drive at that time.  No chest pain, no sob.  Pt states she has noticed some swelling.  Unable to visual any significant swelling.

## 2023-07-24 NOTE — ED Notes (Signed)
 Pt alert and oriented X 4 at the time of discharge. RR even and unlabored. No acute distress noted. Pt verbalized understanding of discharge instructions as discussed. Pt ambulatory to lobby at time of discharge.
